# Patient Record
Sex: Female | Born: 1965 | Race: White | Hispanic: No | Smoking: Never smoker
Health system: Southern US, Community
[De-identification: ages and names within clinical notes are randomized; demographics above are authoritative.]

## PROBLEM LIST (undated history)

## (undated) DIAGNOSIS — Z460 Encounter for fitting and adjustment of spectacles and contact lenses: Secondary | ICD-10-CM

## (undated) DIAGNOSIS — F419 Anxiety disorder, unspecified: Secondary | ICD-10-CM

## (undated) DIAGNOSIS — F329 Major depressive disorder, single episode, unspecified: Secondary | ICD-10-CM

## (undated) DIAGNOSIS — F32A Depression, unspecified: Secondary | ICD-10-CM

## (undated) DIAGNOSIS — I10 Essential (primary) hypertension: Secondary | ICD-10-CM

## (undated) HISTORY — DX: Major depressive disorder, single episode, unspecified: F32.9

## (undated) HISTORY — PX: ECTOPIC PREGNANCY SURGERY: SHX613

## (undated) HISTORY — PX: INNER EAR SURGERY: SHX679

## (undated) HISTORY — DX: Depression, unspecified: F32.A

## (undated) HISTORY — DX: Anxiety disorder, unspecified: F41.9

---

## 1997-09-25 ENCOUNTER — Ambulatory Visit (HOSPITAL_COMMUNITY): Admission: RE | Admit: 1997-09-25 | Discharge: 1997-09-25 | Payer: Self-pay | Admitting: Gynecology

## 1997-11-14 ENCOUNTER — Other Ambulatory Visit: Admission: RE | Admit: 1997-11-14 | Discharge: 1997-11-14 | Payer: Self-pay | Admitting: Gynecology

## 1999-01-15 ENCOUNTER — Other Ambulatory Visit: Admission: RE | Admit: 1999-01-15 | Discharge: 1999-01-15 | Payer: Self-pay | Admitting: Gynecology

## 1999-07-01 ENCOUNTER — Inpatient Hospital Stay (HOSPITAL_COMMUNITY): Admission: AD | Admit: 1999-07-01 | Discharge: 1999-07-01 | Payer: Self-pay | Admitting: Gynecology

## 1999-07-09 ENCOUNTER — Encounter (INDEPENDENT_AMBULATORY_CARE_PROVIDER_SITE_OTHER): Payer: Self-pay

## 1999-07-09 ENCOUNTER — Inpatient Hospital Stay (HOSPITAL_COMMUNITY): Admission: AD | Admit: 1999-07-09 | Discharge: 1999-07-10 | Payer: Self-pay | Admitting: Gynecology

## 1999-12-26 ENCOUNTER — Encounter: Payer: Self-pay | Admitting: Family Medicine

## 1999-12-26 ENCOUNTER — Encounter: Admission: RE | Admit: 1999-12-26 | Discharge: 1999-12-26 | Payer: Self-pay | Admitting: Family Medicine

## 2000-03-27 ENCOUNTER — Other Ambulatory Visit: Admission: RE | Admit: 2000-03-27 | Discharge: 2000-03-27 | Payer: Self-pay | Admitting: Gynecology

## 2001-04-27 ENCOUNTER — Other Ambulatory Visit: Admission: RE | Admit: 2001-04-27 | Discharge: 2001-04-27 | Payer: Self-pay | Admitting: Gynecology

## 2002-06-13 ENCOUNTER — Other Ambulatory Visit: Admission: RE | Admit: 2002-06-13 | Discharge: 2002-06-13 | Payer: Self-pay | Admitting: Gynecology

## 2003-08-01 ENCOUNTER — Other Ambulatory Visit: Admission: RE | Admit: 2003-08-01 | Discharge: 2003-08-01 | Payer: Self-pay | Admitting: Gynecology

## 2003-08-30 ENCOUNTER — Ambulatory Visit (HOSPITAL_COMMUNITY): Admission: RE | Admit: 2003-08-30 | Discharge: 2003-08-30 | Payer: Self-pay | Admitting: Gynecology

## 2004-08-27 ENCOUNTER — Other Ambulatory Visit: Admission: RE | Admit: 2004-08-27 | Discharge: 2004-08-27 | Payer: Self-pay | Admitting: Gynecology

## 2005-01-22 ENCOUNTER — Emergency Department (HOSPITAL_COMMUNITY): Admission: EM | Admit: 2005-01-22 | Discharge: 2005-01-22 | Payer: Self-pay | Admitting: Emergency Medicine

## 2006-01-22 ENCOUNTER — Other Ambulatory Visit: Admission: RE | Admit: 2006-01-22 | Discharge: 2006-01-22 | Payer: Self-pay | Admitting: Gynecology

## 2006-07-04 ENCOUNTER — Emergency Department (HOSPITAL_COMMUNITY): Admission: EM | Admit: 2006-07-04 | Discharge: 2006-07-04 | Payer: Self-pay | Admitting: Family Medicine

## 2007-02-16 ENCOUNTER — Other Ambulatory Visit: Admission: RE | Admit: 2007-02-16 | Discharge: 2007-02-16 | Payer: Self-pay | Admitting: Gynecology

## 2008-09-26 ENCOUNTER — Ambulatory Visit (HOSPITAL_COMMUNITY): Admission: RE | Admit: 2008-09-26 | Discharge: 2008-09-26 | Payer: Self-pay | Admitting: Gynecology

## 2008-10-02 ENCOUNTER — Encounter: Admission: RE | Admit: 2008-10-02 | Discharge: 2008-10-02 | Payer: Self-pay | Admitting: Gynecology

## 2009-03-29 ENCOUNTER — Encounter: Admission: RE | Admit: 2009-03-29 | Discharge: 2009-03-29 | Payer: Self-pay | Admitting: Gynecology

## 2009-10-11 ENCOUNTER — Encounter: Admission: RE | Admit: 2009-10-11 | Discharge: 2009-10-11 | Payer: Self-pay | Admitting: Gynecology

## 2012-10-25 ENCOUNTER — Other Ambulatory Visit: Payer: Self-pay | Admitting: Gynecology

## 2012-10-25 DIAGNOSIS — R928 Other abnormal and inconclusive findings on diagnostic imaging of breast: Secondary | ICD-10-CM

## 2012-11-23 ENCOUNTER — Ambulatory Visit
Admission: RE | Admit: 2012-11-23 | Discharge: 2012-11-23 | Disposition: A | Discharge status: Home / Self Care | Payer: BC Managed Care – PPO | Source: Ambulatory Visit | Attending: Gynecology | Admitting: Gynecology

## 2012-11-23 ENCOUNTER — Other Ambulatory Visit: Payer: Self-pay | Admitting: Gynecology

## 2012-11-23 DIAGNOSIS — R928 Other abnormal and inconclusive findings on diagnostic imaging of breast: Secondary | ICD-10-CM

## 2012-11-23 DIAGNOSIS — N6320 Unspecified lump in the left breast, unspecified quadrant: Secondary | ICD-10-CM

## 2012-11-30 ENCOUNTER — Other Ambulatory Visit: Payer: Self-pay | Admitting: Gynecology

## 2012-11-30 ENCOUNTER — Ambulatory Visit
Admission: RE | Admit: 2012-11-30 | Discharge: 2012-11-30 | Disposition: A | Discharge status: Home / Self Care | Payer: BC Managed Care – PPO | Source: Ambulatory Visit | Attending: Gynecology | Admitting: Gynecology

## 2012-11-30 DIAGNOSIS — N6320 Unspecified lump in the left breast, unspecified quadrant: Secondary | ICD-10-CM

## 2012-12-09 ENCOUNTER — Encounter (INDEPENDENT_AMBULATORY_CARE_PROVIDER_SITE_OTHER): Payer: Self-pay | Admitting: Surgery

## 2012-12-09 ENCOUNTER — Ambulatory Visit (INDEPENDENT_AMBULATORY_CARE_PROVIDER_SITE_OTHER): Payer: BC Managed Care – PPO | Admitting: Surgery

## 2012-12-09 VITALS — BP 160/92 | HR 64 | Resp 16 | Ht 63.5 in | Wt 206.0 lb

## 2012-12-09 DIAGNOSIS — D249 Benign neoplasm of unspecified breast: Secondary | ICD-10-CM | POA: Insufficient documentation

## 2012-12-09 DIAGNOSIS — D242 Benign neoplasm of left breast: Secondary | ICD-10-CM

## 2012-12-09 NOTE — Patient Instructions (Signed)
We will schedule surgery to remove a small lump from the left breast

## 2012-12-09 NOTE — Progress Notes (Signed)
Patient ID: Bonnie Russell, female   DOB: [birth year], 47 y.o.   MRN: [id number]  Chief Complaint  Patient presents with  . Breast Mass    left    HPI [name] is a 47 y.o. female.  A recent mammogram showed an abnormality with calcifications in the upper outer quadrant of the left breast. 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 Surgical excisional biopsy was recommended. She is having no other breast problems or symptoms and has not had prior breast problems. She is not clear on her family history as her mother was adopted.  HPI  Past Medical History  Diagnosis Date  . Anxiety   . Depression     Past Surgical History  Procedure Laterality Date  . Inner ear surgery  as child    x 2  . Ectopic pregnancy surgery  1992, 1998, 2001    Family History  Problem Relation Age of Onset  . Throat cancer Paternal Grandfather     Social History History  Substance Use Topics  . Smoking status: Never Smoker   . Smokeless tobacco: Not on file  . Alcohol Use: Yes     Comment: 2-3 beers/wine weekly    Allergies  Allergen Reactions  . Morphine And Related Itching    Current Outpatient Prescriptions  Medication Sig Dispense Refill  . ALPRAZolam (XANAX) 1 MG tablet Take 1 mg by mouth at bedtime as needed for sleep.      . Ascorbic Acid (VITAMIN C) 1000 MG tablet Take 1,000 mg by mouth daily.      . citalopram (CELEXA) 40 MG tablet Take 40 mg by mouth daily.      . Multiple Vitamin (MULTIVITAMIN) tablet Take 1 tablet by mouth daily.      . valACYclovir (VALTREX) 500 MG tablet Take 500 mg by mouth 2 (two) times daily.      . vitamin E (VITAMIN E) 400 UNIT capsule Take 400 Units by mouth daily.       No current facility-administered medications for this visit.    Review of Systems Review of Systems  Constitutional: Negative for fever, chills and unexpected weight change.  HENT: Negative for hearing loss, congestion, sore  throat, trouble swallowing and voice change.   Eyes: Negative for visual disturbance.  Respiratory: Negative for cough and wheezing.   Cardiovascular: Negative for chest pain, palpitations and leg swelling.  Gastrointestinal: Negative for nausea, vomiting, abdominal pain, diarrhea, constipation, blood in stool, abdominal distention and anal bleeding.  Genitourinary: Negative for hematuria, vaginal bleeding and difficulty urinating.  Musculoskeletal: Negative for arthralgias.  Skin: Negative for rash and wound.  Neurological: Negative for seizures, syncope and headaches.  Hematological: Negative for adenopathy. Does not bruise/bleed easily.  Psychiatric/Behavioral: Negative for confusion.    Blood pressure 160/92, pulse 64, resp. rate 16, height 5' 3.5" (1.613 m), weight 206 lb (93.441 kg).  Physical Exam Physical Exam  Pulmonary/Chest:    Lymphadenopathy:    She has no cervical adenopathy.    She has no axillary adenopathy.       Right: No supraclavicular adenopathy present.       Left: No supraclavicular adenopathy present.    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 She understand that the  chance of removal of the abnormal area is very good, but that occasionally we are unable to locate it and may have to do a second procedure. We also discussed the possibility of a second procedure to get additional tissue. Risks of surgery such as bleeding and infection have also been explained, as well as the implications of not doing the surgery. She understands and wishes to proceed.         Alinna Siple J 12/09/2012, 3:33 PM

## 2013-02-22 ENCOUNTER — Other Ambulatory Visit (INDEPENDENT_AMBULATORY_CARE_PROVIDER_SITE_OTHER): Payer: Self-pay | Admitting: Surgery

## 2013-02-22 DIAGNOSIS — N6320 Unspecified lump in the left breast, unspecified quadrant: Secondary | ICD-10-CM

## 2013-02-24 ENCOUNTER — Encounter (HOSPITAL_BASED_OUTPATIENT_CLINIC_OR_DEPARTMENT_OTHER): Payer: Self-pay | Admitting: *Deleted

## 2013-03-03 ENCOUNTER — Encounter (HOSPITAL_BASED_OUTPATIENT_CLINIC_OR_DEPARTMENT_OTHER): Payer: BC Managed Care – PPO | Admitting: Anesthesiology

## 2013-03-03 ENCOUNTER — Ambulatory Visit (HOSPITAL_BASED_OUTPATIENT_CLINIC_OR_DEPARTMENT_OTHER)
Admission: RE | Admit: 2013-03-03 | Discharge: 2013-03-03 | Disposition: A | Discharge status: Home / Self Care | Payer: BC Managed Care – PPO | Source: Ambulatory Visit | Attending: Surgery | Admitting: Surgery

## 2013-03-03 ENCOUNTER — Ambulatory Visit (HOSPITAL_BASED_OUTPATIENT_CLINIC_OR_DEPARTMENT_OTHER): Payer: BC Managed Care – PPO | Admitting: Anesthesiology

## 2013-03-03 ENCOUNTER — Encounter (HOSPITAL_BASED_OUTPATIENT_CLINIC_OR_DEPARTMENT_OTHER): Payer: Self-pay | Admitting: *Deleted

## 2013-03-03 ENCOUNTER — Encounter (HOSPITAL_BASED_OUTPATIENT_CLINIC_OR_DEPARTMENT_OTHER)
Admission: RE | Disposition: A | Discharge status: Home / Self Care | Payer: Self-pay | Source: Ambulatory Visit | Attending: Surgery

## 2013-03-03 ENCOUNTER — Ambulatory Visit
Admission: RE | Admit: 2013-03-03 | Discharge: 2013-03-03 | Disposition: A | Discharge status: Home / Self Care | Payer: BC Managed Care – PPO | Source: Ambulatory Visit | Attending: Surgery | Admitting: Surgery

## 2013-03-03 DIAGNOSIS — N6019 Diffuse cystic mastopathy of unspecified breast: Secondary | ICD-10-CM

## 2013-03-03 DIAGNOSIS — R928 Other abnormal and inconclusive findings on diagnostic imaging of breast: Secondary | ICD-10-CM | POA: Insufficient documentation

## 2013-03-03 DIAGNOSIS — N6320 Unspecified lump in the left breast, unspecified quadrant: Secondary | ICD-10-CM

## 2013-03-03 DIAGNOSIS — R920 Mammographic microcalcification found on diagnostic imaging of breast: Secondary | ICD-10-CM

## 2013-03-03 DIAGNOSIS — D242 Benign neoplasm of left breast: Secondary | ICD-10-CM

## 2013-03-03 HISTORY — DX: Encounter for fitting and adjustment of spectacles and contact lenses: Z46.0

## 2013-03-03 HISTORY — PX: BREAST BIOPSY: SHX20

## 2013-03-03 LAB — POCT HEMOGLOBIN-HEMACUE: Hemoglobin: 14.4 g/dL (ref 12.0–15.0)

## 2013-03-03 SURGERY — BREAST BIOPSY WITH NEEDLE LOCALIZATION
Anesthesia: General | Site: Breast | Laterality: Left | Wound class: Clean

## 2013-03-03 MED ORDER — OXYCODONE HCL 5 MG PO TABS
5.0000 mg | ORAL_TABLET | Freq: Once | ORAL | Status: AC | PRN
  Administered 2013-03-03: 5 mg via ORAL

## 2013-03-03 MED ORDER — LIDOCAINE HCL (CARDIAC) 20 MG/ML IV SOLN
INTRAVENOUS | Status: DC | PRN
  Administered 2013-03-03: 100 mg via INTRAVENOUS

## 2013-03-03 MED ORDER — HYDROMORPHONE HCL PF 1 MG/ML IJ SOLN
0.2500 mg | INTRAMUSCULAR | Status: DC | PRN
  Administered 2013-03-03 (×2): 0.25 mg via INTRAVENOUS

## 2013-03-03 MED ORDER — CHLORHEXIDINE GLUCONATE 4 % EX LIQD: 1.0000 "application " | Freq: Once | CUTANEOUS | Status: DC

## 2013-03-03 MED ORDER — FENTANYL CITRATE 0.05 MG/ML IJ SOLN
INTRAMUSCULAR | Status: DC | PRN
  Administered 2013-03-03 (×2): 50 ug via INTRAVENOUS

## 2013-03-03 MED ORDER — DEXAMETHASONE SODIUM PHOSPHATE 4 MG/ML IJ SOLN
INTRAMUSCULAR | Status: DC | PRN
  Administered 2013-03-03: 10 mg via INTRAVENOUS

## 2013-03-03 MED ORDER — LACTATED RINGERS IV SOLN
INTRAVENOUS | Status: DC
  Administered 2013-03-03: 11:00:00 via INTRAVENOUS

## 2013-03-03 MED ORDER — OXYCODONE HCL 5 MG/5ML PO SOLN: 5.0000 mg | Freq: Once | ORAL | Status: AC | PRN

## 2013-03-03 MED ORDER — CEFAZOLIN SODIUM-DEXTROSE 2-3 GM-% IV SOLR
2.0000 g | INTRAVENOUS | Status: AC
  Administered 2013-03-03: 2 g via INTRAVENOUS

## 2013-03-03 MED ORDER — MIDAZOLAM HCL 2 MG/2ML IJ SOLN: 1.0000 mg | INTRAMUSCULAR | Status: DC | PRN

## 2013-03-03 MED ORDER — PROPOFOL 10 MG/ML IV BOLUS
INTRAVENOUS | Status: DC | PRN
  Administered 2013-03-03: 200 mg via INTRAVENOUS

## 2013-03-03 MED ORDER — MIDAZOLAM HCL 5 MG/5ML IJ SOLN
INTRAMUSCULAR | Status: DC | PRN
  Administered 2013-03-03: 2 mg via INTRAVENOUS

## 2013-03-03 MED ORDER — FENTANYL CITRATE 0.05 MG/ML IJ SOLN: 50.0000 ug | INTRAMUSCULAR | Status: DC | PRN

## 2013-03-03 MED ORDER — BUPIVACAINE HCL (PF) 0.25 % IJ SOLN
INTRAMUSCULAR | Status: DC | PRN
  Administered 2013-03-03: 20 mL

## 2013-03-03 MED ORDER — ONDANSETRON HCL 4 MG/2ML IJ SOLN
INTRAMUSCULAR | Status: DC | PRN
  Administered 2013-03-03: 4 mg via INTRAMUSCULAR

## 2013-03-03 MED ORDER — METOCLOPRAMIDE HCL 5 MG/ML IJ SOLN: 10.0000 mg | Freq: Once | INTRAMUSCULAR | Status: DC | PRN

## 2013-03-03 MED ORDER — HYDROCODONE-ACETAMINOPHEN 10-325 MG PO TABS: 1.0000 | ORAL_TABLET | Freq: Four times a day (QID) | ORAL | Status: AC | PRN

## 2013-03-03 SURGICAL SUPPLY — 50 items
ADH SKN CLS APL DERMABOND .7 (GAUZE/BANDAGES/DRESSINGS) ×1
APPLICATOR COTTON TIP 6IN STRL (MISCELLANEOUS) IMPLANT
BINDER BREAST LRG (GAUZE/BANDAGES/DRESSINGS) IMPLANT
BINDER BREAST MEDIUM (GAUZE/BANDAGES/DRESSINGS) IMPLANT
BINDER BREAST XLRG (GAUZE/BANDAGES/DRESSINGS) ×1 IMPLANT
BINDER BREAST XXLRG (GAUZE/BANDAGES/DRESSINGS) IMPLANT
BLADE HEX COATED 2.75 (ELECTRODE) ×2 IMPLANT
BLADE SURG 15 STRL LF DISP TIS (BLADE) ×1 IMPLANT
BLADE SURG 15 STRL SS (BLADE) ×2
CANISTER SUCTION 1200CC (MISCELLANEOUS) ×2 IMPLANT
CHLORAPREP W/TINT 26ML (MISCELLANEOUS) ×2 IMPLANT
CLIP TI MEDIUM 6 (CLIP) IMPLANT
CLIP TI WIDE RED SMALL 6 (CLIP) IMPLANT
CLOTH BEACON ORANGE TIMEOUT ST (SAFETY) ×1 IMPLANT
COVER MAYO STAND STRL (DRAPES) ×2 IMPLANT
COVER TABLE BACK 60X90 (DRAPES) ×2 IMPLANT
DECANTER SPIKE VIAL GLASS SM (MISCELLANEOUS) IMPLANT
DERMABOND ADVANCED (GAUZE/BANDAGES/DRESSINGS) ×1
DERMABOND ADVANCED .7 DNX12 (GAUZE/BANDAGES/DRESSINGS) ×1 IMPLANT
DEVICE DUBIN W/COMP PLATE 8390 (MISCELLANEOUS) ×2 IMPLANT
DRAPE LAPAROTOMY TRNSV 102X78 (DRAPE) ×2 IMPLANT
DRAPE SURG 17X23 STRL (DRAPES) IMPLANT
DRAPE UTILITY XL STRL (DRAPES) ×3 IMPLANT
ELECT REM PT RETURN 9FT ADLT (ELECTROSURGICAL) ×2
ELECTRODE REM PT RTRN 9FT ADLT (ELECTROSURGICAL) ×1 IMPLANT
GLOVE BIOGEL PI IND STRL 7.5 (GLOVE) IMPLANT
GLOVE BIOGEL PI INDICATOR 7.5 (GLOVE) ×1
GLOVE ECLIPSE 7.0 STRL STRAW (GLOVE) ×1 IMPLANT
GLOVE EUDERMIC 7 POWDERFREE (GLOVE) ×2 IMPLANT
GOWN PREVENTION PLUS XLARGE (GOWN DISPOSABLE) ×3 IMPLANT
GOWN PREVENTION PLUS XXLARGE (GOWN DISPOSABLE) ×1 IMPLANT
KIT MARKER MARGIN INK (KITS) IMPLANT
NDL HYPO 25X1 1.5 SAFETY (NEEDLE) ×1 IMPLANT
NEEDLE HYPO 25X1 1.5 SAFETY (NEEDLE) ×2 IMPLANT
NS IRRIG 1000ML POUR BTL (IV SOLUTION) ×1 IMPLANT
PACK BASIN DAY SURGERY FS (CUSTOM PROCEDURE TRAY) ×2 IMPLANT
PENCIL BUTTON HOLSTER BLD 10FT (ELECTRODE) ×2 IMPLANT
SHEET MEDIUM DRAPE 40X70 STRL (DRAPES) IMPLANT
SLEEVE SCD COMPRESS KNEE MED (MISCELLANEOUS) ×2 IMPLANT
SPONGE GAUZE 4X4 12PLY (GAUZE/BANDAGES/DRESSINGS) IMPLANT
SPONGE INTESTINAL PEANUT (DISPOSABLE) IMPLANT
SPONGE LAP 4X18 X RAY DECT (DISPOSABLE) ×2 IMPLANT
STAPLER VISISTAT 35W (STAPLE) IMPLANT
SUT MNCRL AB 4-0 PS2 18 (SUTURE) ×2 IMPLANT
SUT SILK 0 TIES 10X30 (SUTURE) IMPLANT
SUT VICRYL 3-0 CR8 SH (SUTURE) ×2 IMPLANT
SYR CONTROL 10ML LL (SYRINGE) ×2 IMPLANT
TOWEL OR NON WOVEN STRL DISP B (DISPOSABLE) ×2 IMPLANT
TUBE CONNECTING 20X1/4 (TUBING) ×2 IMPLANT
YANKAUER SUCT BULB TIP NO VENT (SUCTIONS) ×2 IMPLANT

## 2013-03-03 NOTE — Op Note (Signed)
Bonnie Russell  05/17/1966  [id number]  03/03/2013   Preoperative diagnosis: left breast mass, upper outer quadrant, papilloma by needle core biopsy  Postoperative diagnosis: same  Procedure: wire localized excision left breast mass  Surgeon: Currie Paris, MD, FACS  Anesthesia: General  Clinical History and Indications: this patient presents for a guidewire localized excision of a left breast mass, upper outer quadrant, papilloma on the core biopsy.  Description of procedure: The patient was seen in the holding area and the plans for the procedure reviewed. The left breast was marked as the operative side. The wire localizing films were reviewed.  The patient was taken to the operating room and after satisfactory general anesthesia had been obtained the left breast was prepped and draped and the timeout was performed.  The incision was made over the presumed area of the mass. The guidewire entered the lateral and track somewhat superior and more medial. The abnormality appeared to be in line with the lateral edges of the areola but about 2-3 cm above it.Skin flaps were raised and using cautery the area was completely excised. Bleeders were controlled with either cautery or sutures as needed.  A specimen mammogram showed the clip to be in the middle of the surgical specimen. The specimen was marked with sutures and ink for pathological orientation.  After achieving hemostasis, the incision was closed with 3-0 Vicryl, 4-0 Monocryl subcuticular, and Dermabond.  The patient tolerated the procedure well. There were no operative complications. All counts were correct.   EBL: minimal  Currie Paris, MD, FACS 03/03/2013 12:04 PM

## 2013-03-03 NOTE — Transfer of Care (Signed)
Immediate Anesthesia Transfer of Care Note  Patient: Bonnie Russell  Procedure(s) Performed: Procedure(s):  NEEDLE LOCALIZATION REMOVE LEFT BREAST LUMP (Left)  Patient Location: PACU  Anesthesia Type:General  Level of Consciousness: awake, oriented and patient cooperative  Airway & Oxygen Therapy: Patient Spontanous Breathing and Patient connected to face mask oxygen  Post-op Assessment: Report given to PACU RN and Post -op Vital signs reviewed and stable  Post vital signs: Reviewed and stable  Complications: No apparent anesthesia complications

## 2013-03-03 NOTE — Anesthesia Preprocedure Evaluation (Signed)
Anesthesia Evaluation  Patient identified by MRN, date of birth, ID band Patient awake    Reviewed: Allergy & Precautions, H&P , NPO status , Patient's Chart, lab work & pertinent test results, reviewed documented beta blocker date and time   Airway Mallampati: II TM Distance: >3 FB Neck ROM: full    Dental   Pulmonary neg pulmonary ROS,  breath sounds clear to auscultation        Cardiovascular negative cardio ROS  Rhythm:regular     Neuro/Psych PSYCHIATRIC DISORDERS Anxiety Depression negative neurological ROS     GI/Hepatic negative GI ROS, Neg liver ROS,   Endo/Other  negative endocrine ROS  Renal/GU negative Renal ROS  negative genitourinary   Musculoskeletal   Abdominal   Peds  Hematology negative hematology ROS (+)   Anesthesia Other Findings See surgeon's H&P   Reproductive/Obstetrics negative OB ROS                           Anesthesia Physical Anesthesia Plan  ASA: II  Anesthesia Plan: General   Post-op Pain Management:    Induction: Intravenous  Airway Management Planned: LMA  Additional Equipment:   Intra-op Plan:   Post-operative Plan:   Informed Consent: I have reviewed the patients History and Physical, chart, labs and discussed the procedure including the risks, benefits and alternatives for the proposed anesthesia with the patient or authorized representative who has indicated his/her understanding and acceptance.   Dental Advisory Given  Plan Discussed with: CRNA and Surgeon  Anesthesia Plan Comments:         Anesthesia Quick Evaluation

## 2013-03-03 NOTE — Anesthesia Postprocedure Evaluation (Signed)
Anesthesia Post Note  Patient: Bonnie Russell  Procedure(s) Performed: Procedure(s) (LRB):  NEEDLE LOCALIZATION REMOVE LEFT BREAST LUMP (Left)  Anesthesia type: General  Patient location: PACU  Post pain: Pain level controlled  Post assessment: Patient's Cardiovascular Status Stable  Last Vitals:  Filed Vitals:   03/03/13 1245  BP: 143/90  Pulse: 84  Temp:   Resp: 16    Post vital signs: Reviewed and stable  Level of consciousness: alert  Complications: No apparent anesthesia complications

## 2013-03-03 NOTE — H&P (Signed)
NAME: Bonnie Russell       DOB: 11-13-1965           DATE: 02/22/2013       WUJ:[id number]  CC: No chief complaint on file.   HPI:  Chief Complaint   Patient presents with   .  Breast Mass     left   HPI  Bonnie Russell is a 47 y.o. female. A recent mammogram showed an abnormality with calcifications in the upper outer quadrant of the left breast. 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 Surgical excisional biopsy was recommended. She is having no other breast problems or symptoms and has not had prior breast problems. She is not clear on her family history as her mother was adopted. She presents today for wire localized excision   EXAM: Vital signs: BP 153/99  Pulse 92  Temp(Src) 97.8 F (36.6 C) (Oral)  Resp 20  Ht 5' 3.5" (1.613 m)  Wt 212 lb (96.163 kg)  BMI 36.96 kg/m2  SpO2 97%  LMP 02/28/2013  GENERAL:  The patient is alert, oriented, and generally healthy-appearing, NAD. Mood and affect are normal.  HEENT:  The head is normocephalic, the eyes nonicteric, the pupils were round regular and equal. EOMs are normal. Pharynx normal. Dentition good.  NECK:  The neck is supple and there are no masses or thyromegaly.  LUNGS: Normal respirations and clear to auscultation.  HEART: Regular rhythm, with no murmurs rubs or gallops. Pulses are intact carotid dorsalis pedis and posterior tibial. No significant varicosities are noted.  BREASTS:  Symmetric, no palp ass, guuide wire in place on left  ABDOMEN: Soft, flat, and nontender. No masses or organomegaly is noted. No hernias are noted. Bowel sounds are normal.  EXTREMITIES:  Good range of motion, no edema.  IMP: Left breast mass, prob papilloma  PLAN: I have discussed the indications for the lumpectomy and described the procedure. She understand that the chance of removal of the abnormal area is very good, but that occasionally we are unable to locate it and may  have to do a second procedure. We also discussed the possibility of a second procedure to get additional tissue. Risks of surgery such as bleeding and infection have also been explained, as well as the implications of not doing the surgery. She understands and wishes to proceed.   Erma Joubert J 03/03/2013

## 2013-03-03 NOTE — Anesthesia Procedure Notes (Signed)
Procedure Name: LMA Insertion Date/Time: 03/03/2013 11:27 AM Performed by: Gar Gibbon Pre-anesthesia Checklist: Patient identified, Emergency Drugs available, Suction available and Patient being monitored Patient Re-evaluated:Patient Re-evaluated prior to inductionOxygen Delivery Method: Circle System Utilized Preoxygenation: Pre-oxygenation with 100% oxygen Intubation Type: IV induction Ventilation: Mask ventilation without difficulty LMA: LMA inserted LMA Size: 4.0 Number of attempts: 1 Airway Equipment and Method: bite block Placement Confirmation: positive ETCO2 Tube secured with: Tape Dental Injury: Teeth and Oropharynx as per pre-operative assessment

## 2013-03-04 ENCOUNTER — Encounter (HOSPITAL_BASED_OUTPATIENT_CLINIC_OR_DEPARTMENT_OTHER): Payer: Self-pay | Admitting: Surgery

## 2013-03-06 NOTE — Progress Notes (Signed)
Quick Note:  Tell her path is benign and as expected ______ 

## 2013-03-07 ENCOUNTER — Telehealth (INDEPENDENT_AMBULATORY_CARE_PROVIDER_SITE_OTHER): Payer: Self-pay

## 2013-03-07 NOTE — Telephone Encounter (Signed)
Spoke with pt relaying below message.  Pt happy with results.

## 2013-03-07 NOTE — Telephone Encounter (Signed)
Message copied by Horatio Pel on Mon Mar 07, 2013 11:13 AM ------      Message from: Currie Paris      Created: Sun Mar 06, 2013  5:26 PM       Tell her path is benign and as expected ------

## 2013-03-07 NOTE — Telephone Encounter (Signed)
Message copied by Horatio Pel on Mon Mar 07, 2013  8:51 AM ------      Message from: Currie Paris      Created: Sun Mar 06, 2013  5:26 PM       Tell her path is benign and as expected ------

## 2013-03-23 ENCOUNTER — Encounter (INDEPENDENT_AMBULATORY_CARE_PROVIDER_SITE_OTHER): Payer: BC Managed Care – PPO | Admitting: Surgery

## 2014-12-12 ENCOUNTER — Other Ambulatory Visit: Payer: Self-pay | Admitting: Gynecology

## 2014-12-13 LAB — CYTOLOGY - PAP

## 2015-10-17 DIAGNOSIS — E66812 Obesity, class 2: Secondary | ICD-10-CM | POA: Insufficient documentation

## 2015-10-17 DIAGNOSIS — F411 Generalized anxiety disorder: Secondary | ICD-10-CM | POA: Insufficient documentation

## 2015-10-17 DIAGNOSIS — F988 Other specified behavioral and emotional disorders with onset usually occurring in childhood and adolescence: Secondary | ICD-10-CM | POA: Insufficient documentation

## 2015-10-17 DIAGNOSIS — F329 Major depressive disorder, single episode, unspecified: Secondary | ICD-10-CM | POA: Insufficient documentation

## 2015-10-17 DIAGNOSIS — F1021 Alcohol dependence, in remission: Secondary | ICD-10-CM | POA: Insufficient documentation

## 2015-10-25 DIAGNOSIS — N951 Menopausal and female climacteric states: Secondary | ICD-10-CM | POA: Insufficient documentation

## 2017-11-04 ENCOUNTER — Encounter (HOSPITAL_COMMUNITY): Payer: Self-pay | Admitting: Emergency Medicine

## 2017-11-04 ENCOUNTER — Emergency Department (HOSPITAL_COMMUNITY)
Admission: EM | Admit: 2017-11-04 | Discharge: 2017-11-04 | Disposition: A | Discharge status: Home / Self Care | Payer: Self-pay | Attending: Emergency Medicine | Admitting: Emergency Medicine

## 2017-11-04 ENCOUNTER — Emergency Department (HOSPITAL_COMMUNITY): Payer: Self-pay

## 2017-11-04 DIAGNOSIS — R5383 Other fatigue: Secondary | ICD-10-CM | POA: Insufficient documentation

## 2017-11-04 DIAGNOSIS — R112 Nausea with vomiting, unspecified: Secondary | ICD-10-CM | POA: Insufficient documentation

## 2017-11-04 DIAGNOSIS — Z79899 Other long term (current) drug therapy: Secondary | ICD-10-CM | POA: Insufficient documentation

## 2017-11-04 DIAGNOSIS — R200 Anesthesia of skin: Secondary | ICD-10-CM | POA: Insufficient documentation

## 2017-11-04 DIAGNOSIS — N3000 Acute cystitis without hematuria: Secondary | ICD-10-CM | POA: Insufficient documentation

## 2017-11-04 LAB — COMPREHENSIVE METABOLIC PANEL
ALT: 65 U/L — ABNORMAL HIGH (ref 14–54)
AST: 100 U/L — ABNORMAL HIGH (ref 15–41)
Albumin: 4.1 g/dL (ref 3.5–5.0)
Alkaline Phosphatase: 115 U/L (ref 38–126)
Anion gap: 10 (ref 5–15)
BUN: 10 mg/dL (ref 6–20)
CHLORIDE: 102 mmol/L (ref 101–111)
CO2: 25 mmol/L (ref 22–32)
Calcium: 8.6 mg/dL — ABNORMAL LOW (ref 8.9–10.3)
Creatinine, Ser: 1.02 mg/dL — ABNORMAL HIGH (ref 0.44–1.00)
GFR calc Af Amer: >60 mL/min (ref >60)
GFR calc non Af Amer: >60 mL/min (ref >60)
Glucose, Bld: 147 mg/dL — ABNORMAL HIGH (ref 65–99)
Potassium: 3.5 mmol/L (ref 3.5–5.1)
SODIUM: 137 mmol/L (ref 135–145)
Total Bilirubin: 0.4 mg/dL (ref 0.3–1.2)
Total Protein: 6.7 g/dL (ref 6.5–8.1)

## 2017-11-04 LAB — CBC
HCT: 34 % — ABNORMAL LOW (ref 36.0–46.0)
HEMOGLOBIN: 10.7 g/dL — AB (ref 12.0–15.0)
MCH: 23.8 pg — AB (ref 26.0–34.0)
MCHC: 31.5 g/dL (ref 30.0–36.0)
MCV: 75.7 fL — ABNORMAL LOW (ref 78.0–100.0)
Platelets: 132 10*3/uL — ABNORMAL LOW (ref 150–400)
RBC: 4.49 MIL/uL (ref 3.87–5.11)
RDW: 15.7 % — ABNORMAL HIGH (ref 11.5–15.5)
WBC: 3.4 10*3/uL — ABNORMAL LOW (ref 4.0–10.5)

## 2017-11-04 LAB — URINALYSIS, ROUTINE W REFLEX MICROSCOPIC
Bilirubin Urine: NEGATIVE
GLUCOSE, UA: NEGATIVE mg/dL
Hgb urine dipstick: NEGATIVE
Ketones, ur: NEGATIVE mg/dL
Nitrite: POSITIVE — AB
Protein, ur: 30 mg/dL — AB
SPECIFIC GRAVITY, URINE: 1.01 (ref 1.005–1.030)
pH: >9 — ABNORMAL HIGH (ref 5.0–8.0)

## 2017-11-04 LAB — LIPASE, BLOOD: LIPASE: 37 U/L (ref 11–51)

## 2017-11-04 LAB — URINALYSIS, MICROSCOPIC (REFLEX)

## 2017-11-04 LAB — I-STAT BETA HCG BLOOD, ED (MC, WL, AP ONLY)

## 2017-11-04 MED ORDER — SODIUM CHLORIDE 0.9 % IV SOLN
1.0000 g | Freq: Once | INTRAVENOUS | Status: AC
  Administered 2017-11-04: 1 g via INTRAVENOUS
  Filled 2017-11-04: qty 10

## 2017-11-04 MED ORDER — SODIUM CHLORIDE 0.9 % IV BOLUS
1000.0000 mL | Freq: Once | INTRAVENOUS | Status: AC
  Administered 2017-11-04: 1000 mL via INTRAVENOUS

## 2017-11-04 MED ORDER — PROMETHAZINE HCL 25 MG PO TABS: 25.0000 mg | ORAL_TABLET | Freq: Four times a day (QID) | ORAL | 1 refills | Status: DC | PRN

## 2017-11-04 MED ORDER — SODIUM CHLORIDE 0.9 % IV SOLN: INTRAVENOUS | Status: DC

## 2017-11-04 MED ORDER — CEPHALEXIN 500 MG PO CAPS: 500.0000 mg | ORAL_CAPSULE | Freq: Four times a day (QID) | ORAL | 0 refills | Status: DC

## 2017-11-04 NOTE — ED Triage Notes (Signed)
Pt states that this morning she spiked a fever 99-100 and knows that something is going on with her body. Having numbness from head to her toes that started this morning. Pt reports that something is wrong, "I am having trouble talking to you can't you tell I am having to talk to you through clinched teeth". Pt only talking that way when focused on it. When answering other questions teeth aren't clinched.

## 2017-11-04 NOTE — Discharge Instructions (Addendum)
Take the antibiotic as directed.  Take the Phenergan as needed for nausea and vomiting.  If you are not improved over the next 2 to 3 days or return for follow-up.  Return earlier for any new or worse symptoms.  Urine culture has been sent and will probably take about 2 days for confirmation of the type of bacteria.  You will be contacted if the antibiotic that you have been given today is not covering the infection.

## 2017-11-04 NOTE — ED Notes (Signed)
ED Provider at bedside. 

## 2017-11-04 NOTE — ED Provider Notes (Signed)
[facility] DEPT Provider Note   CSN: [id number] Arrival date & time: 11/04/17  1302     History   Chief Complaint Chief Complaint  Patient presents with  . Fever  . Fatigue  . Emesis  . Numbness    HPI Bonnie Russell is a 52 y.o. female.  Patient brought in by EMS.  Patient believed that she had a viral type infection a week ago.  Seem to be improving but then fevers came back on Sunday night.  This morning at about 5 in the morning she had sweats and felt feverish and then seemed as if the fever had broke.  But then she went on to have nausea and vomiting about 3-4 episodes today.  No pain anywhere no headache no chest pain no abdominal pain.  No back pain has had no dysuria but has felt her urine has seems strong.  Patient after the nausea and vomiting started to have some type numbness and contractions in her toes and fingers.  Was having difficulty speaking.  Patient was concerned that this may be a stroke.  EMS arrived as to how she was negative on the stroke scale.  Patient did states she was feeling anxious.  Patient does have a history of anxiety and depression.  No new medications.     Past Medical History:  Diagnosis Date  . Anxiety   . Contact lens/glasses fitting    wears contacts or glasses  . Depression     Patient Active Problem List   Diagnosis Date Noted  . Intraductal papilloma of breast 12/09/2012    Past Surgical History:  Procedure Laterality Date  . BREAST BIOPSY Left 03/03/2013   Procedure:  NEEDLE LOCALIZATION REMOVE LEFT BREAST LUMP;  Surgeon: Haywood Lasso, MD;  Location: [facility];  Service: General;  Laterality: Left;  . [place], 2001  . INNER EAR SURGERY  as child   x 2     OB History   None      Home Medications    Prior to Admission medications   Medication Sig Start Date End Date Taking? Authorizing Provider  ALPRAZolam Duanne Moron) 1 MG  tablet Take 1 mg by mouth at bedtime as needed for sleep.   Yes [provider]  buPROPion (WELLBUTRIN SR) 150 MG 12 hr tablet Take 150 mg by mouth 2 (two) times daily. 08/03/17  Yes [provider]  citalopram (CELEXA) 40 MG tablet Take 40 mg by mouth daily.    Yes [provider]  meloxicam (MOBIC) 15 MG tablet Take 15 mg by mouth daily. 08/03/17  Yes [provider]  Phenyleph-Doxylamine-DM-APAP (NYQUIL SEVERE COLD/FLU PO) Take 1 Dose by mouth every 4 (four) hours.   Yes [provider]  valACYclovir (VALTREX) 500 MG tablet Take 500 mg by mouth daily.    Yes [provider]  cephALEXin (KEFLEX) 500 MG capsule Take 1 capsule (500 mg total) by mouth 4 (four) times daily. 11/04/17   Fredia Sorrow, MD  promethazine (PHENERGAN) 25 MG tablet Take 1 tablet (25 mg total) by mouth every 6 (six) hours as needed for nausea or vomiting. 11/04/17   Fredia Sorrow, MD    Family History Family History  Problem Relation Age of Onset  . Throat cancer Paternal Grandfather     Social History Social History   Tobacco Use  . Smoking status: Never Smoker  . Smokeless tobacco: Never Used  Substance Use Topics  .  Alcohol use: Yes    Comment: 2-3 beers/wine weekly  . Drug use: No     Allergies   Morphine and related and Sertraline   Review of Systems Review of Systems  Constitutional: Positive for chills, diaphoresis and fever.  HENT: Negative for congestion.   Eyes: Negative for visual disturbance.  Respiratory: Negative for shortness of breath.   Cardiovascular: Negative for chest pain.  Gastrointestinal: Positive for nausea and vomiting. Negative for abdominal pain.  Genitourinary: Negative for dysuria.  Musculoskeletal: Negative for back pain.  Skin: Negative for rash.  Neurological: Positive for tremors, speech difficulty and numbness. Negative for syncope, weakness and headaches.  Hematological: Does not bruise/bleed easily.    Psychiatric/Behavioral: Negative for confusion.     Physical Exam Updated Vital Signs BP (!) 148/94   Pulse 85   Temp 98.1 F (36.7 C) (Oral)   Resp 17   Ht 1.626 m (5\' 4" )   Wt 79.4 kg (175 lb)   LMP 10/14/2017   SpO2 99%   BMI 30.04 kg/m   Physical Exam  Constitutional: She is oriented to person, place, and time. She appears well-developed and well-nourished. No distress.  HENT:  Head: Normocephalic and atraumatic.  Mouth/Throat: Oropharynx is clear and moist.  Eyes: Pupils are equal, round, and reactive to light. Conjunctivae and EOM are normal.  Neck: Normal range of motion. Neck supple.  Cardiovascular: Normal rate, regular rhythm and normal heart sounds.  Pulmonary/Chest: Effort normal and breath sounds normal. No respiratory distress.  Abdominal: Soft. Bowel sounds are normal. There is no tenderness.  Musculoskeletal: Normal range of motion. She exhibits no edema.  Neurological: She is alert and oriented to person, place, and time. No cranial nerve deficit or sensory deficit. She exhibits normal muscle tone. Coordination normal.  Skin: Skin is warm. Capillary refill takes less than 2 seconds.  Nursing note and vitals reviewed.    ED Treatments / Results  Labs (all labs ordered are listed, but only abnormal results are displayed) Labs Reviewed  COMPREHENSIVE METABOLIC PANEL - Abnormal; Notable for the following components:      Result Value   Glucose, Bld 147 (*)    Creatinine, Ser 1.02 (*)    Calcium 8.6 (*)    AST 100 (*)    ALT 65 (*)    All other components within normal limits  CBC - Abnormal; Notable for the following components:   WBC 3.4 (*)    Hemoglobin 10.7 (*)    HCT 34.0 (*)    MCV 75.7 (*)    MCH 23.8 (*)    RDW 15.7 (*)    Platelets 132 (*)    All other components within normal limits  URINALYSIS, ROUTINE W REFLEX MICROSCOPIC - Abnormal; Notable for the following components:   APPearance CLOUDY (*)    pH >9.0 (*)    Protein, ur 30 (*)     Nitrite POSITIVE (*)    Leukocytes, UA TRACE (*)    All other components within normal limits  URINALYSIS, MICROSCOPIC (REFLEX) - Abnormal; Notable for the following components:   Bacteria, UA MANY (*)    All other components within normal limits  URINE CULTURE  LIPASE, BLOOD  I-STAT BETA HCG BLOOD, ED ([facility], [facility], [facility] ONLY)    EKG None  Radiology Dg Chest 2 View  Result Date: 11/04/2017 CLINICAL DATA:  Generalized weakness and fever this morning, single episode of vomiting EXAM: CHEST - 2 VIEW COMPARISON:  01/22/2005 FINDINGS: Upper normal heart size. Mediastinal  contours and pulmonary vascularity normal. Lungs clear. No pleural effusion or pneumothorax. Bones unremarkable. IMPRESSION: No acute abnormalities. Electronically Signed   By: Lavonia Dana M.D.   On: 11/04/2017 17:29   Ct Head Wo Contrast  Result Date: 11/04/2017 CLINICAL DATA:  Fever and weakness, nausea with single episode of vomiting today, numbness and tingling, ataxia EXAM: CT HEAD WITHOUT CONTRAST TECHNIQUE: Contiguous axial images were obtained from the base of the skull through the vertex without intravenous contrast. Sagittal and coronal MPR images reconstructed from axial data set. COMPARISON:  01/22/2005 FINDINGS: Brain: Beam hardening artifacts from jewelry at the ears. Normal ventricular morphology. No midline shift or mass effect. Mild small vessel chronic ischemic changes of periventricular white matter. Otherwise normal appearance of brain parenchyma. No intracranial hemorrhage, mass lesion, or evidence of acute infarction. No extra-axial fluid collections. Vascular: No hyperdense vessels Skull: Intact Sinuses/Orbits: Clear Other: N/A IMPRESSION: Minimal periventricular white matter small vessel chronic ischemic changes. No acute intracranial abnormalities. Electronically Signed   By: Lavonia Dana M.D.   On: 11/04/2017 17:32    Procedures Procedures (including critical care time)  Medications Ordered in  ED Medications  0.9 %  sodium chloride infusion (has no administration in time range)  cefTRIAXone (ROCEPHIN) 1 g in sodium chloride 0.9 % 100 mL IVPB (0 g Intravenous Stopped 11/04/17 1753)  sodium chloride 0.9 % bolus 1,000 mL (1,000 mLs Intravenous New Bag/Given 11/04/17 1641)     Initial Impression / Assessment and Plan / ED Course  I have reviewed the triage vital signs and the nursing notes.  Pertinent labs & imaging results that were available during my care of the patient were reviewed by me and considered in my medical decision making (see chart for details).    Work-up positive for probable urinary tract infection with positive nitrite.  This could explain the fever chills and diaphoresis and the nausea and vomiting.  Could be early pyelonephritis.  Do not think it explains her symptoms from a week ago.  Rest of work-up negative chest x-ray negative head CT negative.  Labs without significant abnormalities one thing of interest is that there was no well leukocytosis.  Actually white blood cell count was on the low side more suggestive of viral process.  So it could be 2 things ongoing.  Patient received 1 g of Rocephin here for the urinary tract infection.  Urinalysis sent for culture.  And is pending.  Patient will be continued on Keflex and Phenergan.  Patient after IV hydration and some food here feels much better.  Do not feel that patient had a stroke symptoms may have been due to anxiety could have also been a reaction from the recent multiple episodes of vomiting.  Symptoms were bilateral.   Final Clinical Impressions(s) / ED Diagnoses   Final diagnoses:  Acute cystitis without hematuria    ED Discharge Orders        Ordered    cephALEXin (KEFLEX) 500 MG capsule  4 times daily     11/04/17 1811    promethazine (PHENERGAN) 25 MG tablet  Every 6 hours PRN     11/04/17 1811       Fredia Sorrow, MD 11/04/17 1819

## 2017-11-04 NOTE — ED Triage Notes (Signed)
Pt from home by [facility] for fever and weakness x week.  Pt had nausea and one time emesis today. Pt had numbness and tingling today and told EMS she thought she had a stroke, pt was negative for EMS stroke scale. Vitals: 156/100, HR 80, 100 on 2L via n/c for comfort. 99% on room air.  Pt still feels anxious now.

## 2017-11-04 NOTE — ED Notes (Signed)
Patient given meal tray.

## 2017-11-04 NOTE — ED Notes (Signed)
Transporting to radiology.

## 2017-11-07 LAB — URINE CULTURE

## 2017-11-08 ENCOUNTER — Telehealth: Payer: Self-pay

## 2017-11-08 NOTE — Telephone Encounter (Signed)
Post ED Visit - Positive Culture Follow-up  Culture report reviewed by antimicrobial stewardship pharmacist:  []  Elenor Quinones, Pharm.D. []  Heide Guile, Pharm.D., BCPS AQ-ID []  Parks Neptune, Pharm.D., BCPS []  Alycia Rossetti, Pharm.D., BCPS []  [place], Pharm.D., BCPS, AAHIVP []  Legrand Como, Pharm.D., BCPS, AAHIVP [x]  Salome Arnt, PharmD, BCPS []  Wynell Balloon, PharmD []  Vincenza Hews, PharmD, BCPS  Positive urine culture Treated with Cephalexin, organism sensitive to the same and no further patient follow-up is required at this time.  Genia Del 11/08/2017, 11:45 AM

## 2018-10-06 DIAGNOSIS — F3162 Bipolar disorder, current episode mixed, moderate: Secondary | ICD-10-CM | POA: Insufficient documentation

## 2019-03-06 DIAGNOSIS — F321 Major depressive disorder, single episode, moderate: Secondary | ICD-10-CM | POA: Insufficient documentation

## 2019-03-06 DIAGNOSIS — M2550 Pain in unspecified joint: Secondary | ICD-10-CM | POA: Insufficient documentation

## 2019-09-29 ENCOUNTER — Other Ambulatory Visit (HOSPITAL_COMMUNITY): Payer: Self-pay | Admitting: Family Medicine

## 2019-09-29 DIAGNOSIS — Z0000 Encounter for general adult medical examination without abnormal findings: Secondary | ICD-10-CM | POA: Insufficient documentation

## 2020-01-23 ENCOUNTER — Other Ambulatory Visit: Payer: Self-pay

## 2020-01-23 ENCOUNTER — Emergency Department (HOSPITAL_BASED_OUTPATIENT_CLINIC_OR_DEPARTMENT_OTHER): Payer: BC Managed Care – PPO

## 2020-01-23 ENCOUNTER — Emergency Department (HOSPITAL_BASED_OUTPATIENT_CLINIC_OR_DEPARTMENT_OTHER)
Admission: EM | Admit: 2020-01-23 | Discharge: 2020-01-24 | Disposition: A | Discharge status: Home / Self Care | Payer: BC Managed Care – PPO | Attending: Emergency Medicine | Admitting: Emergency Medicine

## 2020-01-23 ENCOUNTER — Encounter (HOSPITAL_BASED_OUTPATIENT_CLINIC_OR_DEPARTMENT_OTHER): Payer: Self-pay | Admitting: *Deleted

## 2020-01-23 DIAGNOSIS — Z86012 Personal history of benign carcinoid tumor: Secondary | ICD-10-CM | POA: Insufficient documentation

## 2020-01-23 DIAGNOSIS — S92331A Displaced fracture of third metatarsal bone, right foot, initial encounter for closed fracture: Secondary | ICD-10-CM | POA: Diagnosis not present

## 2020-01-23 DIAGNOSIS — S62617A Displaced fracture of proximal phalanx of left little finger, initial encounter for closed fracture: Secondary | ICD-10-CM | POA: Diagnosis not present

## 2020-01-23 DIAGNOSIS — Y929 Unspecified place or not applicable: Secondary | ICD-10-CM | POA: Diagnosis not present

## 2020-01-23 DIAGNOSIS — Y939 Activity, unspecified: Secondary | ICD-10-CM | POA: Insufficient documentation

## 2020-01-23 DIAGNOSIS — Y999 Unspecified external cause status: Secondary | ICD-10-CM | POA: Insufficient documentation

## 2020-01-23 DIAGNOSIS — Z79899 Other long term (current) drug therapy: Secondary | ICD-10-CM | POA: Diagnosis not present

## 2020-01-23 DIAGNOSIS — S92902A Unspecified fracture of left foot, initial encounter for closed fracture: Secondary | ICD-10-CM

## 2020-01-23 DIAGNOSIS — S99922A Unspecified injury of left foot, initial encounter: Secondary | ICD-10-CM | POA: Diagnosis present
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
## 2020-01-24 ENCOUNTER — Encounter (HOSPITAL_BASED_OUTPATIENT_CLINIC_OR_DEPARTMENT_OTHER): Payer: Self-pay | Admitting: Emergency Medicine

## 2020-01-24 MED ORDER — DICLOFENAC SODIUM ER 100 MG PO TB24: 100.0000 mg | ORAL_TABLET | Freq: Every day | ORAL | 0 refills | Status: DC

## 2020-01-24 MED ORDER — KETOROLAC TROMETHAMINE 60 MG/2ML IM SOLN
60.0000 mg | Freq: Once | INTRAMUSCULAR | Status: AC
  Administered 2020-01-24: 60 mg via INTRAMUSCULAR
  Filled 2020-01-24: qty 2
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
## 2020-04-04 ENCOUNTER — Other Ambulatory Visit (HOSPITAL_COMMUNITY): Payer: Self-pay | Admitting: Family Medicine

## 2020-04-10 ENCOUNTER — Other Ambulatory Visit (HOSPITAL_COMMUNITY): Payer: Self-pay | Admitting: Student

## 2020-04-16 ENCOUNTER — Other Ambulatory Visit (HOSPITAL_COMMUNITY): Payer: Self-pay | Admitting: Family Medicine

## 2020-04-17 ENCOUNTER — Other Ambulatory Visit (HOSPITAL_COMMUNITY): Payer: Self-pay | Admitting: Family Medicine

## 2020-06-04 ENCOUNTER — Other Ambulatory Visit (HOSPITAL_COMMUNITY): Payer: Self-pay | Admitting: Family Medicine

## 2020-07-09 ENCOUNTER — Other Ambulatory Visit (HOSPITAL_COMMUNITY): Payer: Self-pay | Admitting: Family Medicine

## 2020-07-19 ENCOUNTER — Other Ambulatory Visit (HOSPITAL_COMMUNITY): Payer: Self-pay | Admitting: Family Medicine

## 2020-08-06 ENCOUNTER — Other Ambulatory Visit (HOSPITAL_COMMUNITY): Payer: Self-pay | Admitting: Family Medicine

## 2020-08-20 ENCOUNTER — Other Ambulatory Visit (HOSPITAL_COMMUNITY): Payer: Self-pay | Admitting: Family Medicine

## 2020-09-05 MED FILL — Alprazolam Tab 1 MG: ORAL | 30 days supply | Qty: 90 | Fill #0 | Status: AC

## 2020-09-06 ENCOUNTER — Other Ambulatory Visit (HOSPITAL_COMMUNITY): Payer: Self-pay

## 2020-10-04 ENCOUNTER — Other Ambulatory Visit (HOSPITAL_COMMUNITY): Payer: Self-pay

## 2020-10-04 MED ORDER — VALACYCLOVIR HCL 500 MG PO TABS
ORAL_TABLET | ORAL | 3 refills | Status: DC
  Filled 2020-10-04: qty 30, 30d supply, fill #0
  Filled 2020-11-05: qty 30, 30d supply, fill #1
  Filled 2020-12-03: qty 30, 30d supply, fill #2
  Filled 2020-12-27: qty 30, 30d supply, fill #3
  Filled 2020-12-28: qty 30, 30d supply, fill #0
  Filled 2021-03-06: qty 30, 30d supply, fill #1
  Filled 2021-04-04: qty 30, 30d supply, fill #2
  Filled 2021-05-02: qty 30, 30d supply, fill #3
  Filled 2021-06-06: qty 30, 30d supply, fill #4
  Filled 2021-07-11: qty 30, 30d supply, fill #5
  Filled 2021-09-27: qty 30, 30d supply, fill #6
  Filled 2021-10-02: qty 30, 30d supply, fill #7

## 2020-10-04 MED FILL — Duloxetine HCl Enteric Coated Pellets Cap 30 MG (Base Eq): ORAL | 30 days supply | Qty: 30 | Fill #0 | Status: AC

## 2020-10-05 ENCOUNTER — Other Ambulatory Visit (HOSPITAL_COMMUNITY): Payer: Self-pay

## 2020-10-05 MED ORDER — ALPRAZOLAM 1 MG PO TABS
ORAL_TABLET | ORAL | 1 refills | Status: DC
  Filled 2020-10-05: qty 90, 30d supply, fill #0
  Filled 2020-11-05: qty 90, 30d supply, fill #1

## 2020-10-11 ENCOUNTER — Other Ambulatory Visit (HOSPITAL_COMMUNITY): Payer: Self-pay

## 2020-10-11 MED ORDER — DULOXETINE HCL 60 MG PO CPEP
60.0000 mg | ORAL_CAPSULE | Freq: Every day | ORAL | 2 refills | Status: AC
  Filled 2020-10-18: qty 30, 30d supply, fill #0
  Filled 2020-11-26: qty 30, 30d supply, fill #1
  Filled 2020-12-27: qty 30, 30d supply, fill #2
  Filled 2020-12-28: qty 30, 30d supply, fill #0
  Filled 2021-01-25: qty 30, 30d supply, fill #1
  Filled 2021-03-06: qty 30, 30d supply, fill #2
  Filled 2021-04-04: qty 30, 30d supply, fill #3
  Filled 2021-05-02: qty 30, 30d supply, fill #4
  Filled 2021-06-06: qty 30, 30d supply, fill #5
  Filled 2021-07-11: qty 30, 30d supply, fill #6

## 2020-10-18 ENCOUNTER — Other Ambulatory Visit (HOSPITAL_COMMUNITY): Payer: Self-pay

## 2020-10-18 MED ORDER — METOPROLOL SUCCINATE ER 25 MG PO TB24
1.0000 | ORAL_TABLET | Freq: Every day | ORAL | 2 refills | Status: DC
  Filled 2020-10-18: qty 30, 30d supply, fill #0
  Filled 2020-11-26: qty 30, 30d supply, fill #1
  Filled 2020-12-27: qty 30, 30d supply, fill #2
  Filled 2020-12-28: qty 30, 30d supply, fill #0

## 2020-11-05 ENCOUNTER — Other Ambulatory Visit (HOSPITAL_COMMUNITY): Payer: Self-pay

## 2020-11-05 MED ORDER — MELOXICAM 15 MG PO TABS
ORAL_TABLET | ORAL | 3 refills | Status: DC
  Filled 2020-11-05: qty 30, 30d supply, fill #0
  Filled 2020-12-03: qty 30, 30d supply, fill #1
  Filled 2020-12-27: qty 30, 30d supply, fill #2
  Filled 2020-12-28: qty 30, 30d supply, fill #0
  Filled 2021-01-25: qty 30, 30d supply, fill #1
  Filled 2021-04-04: qty 30, 30d supply, fill #2
  Filled 2021-05-02: qty 30, 30d supply, fill #3
  Filled 2021-06-06: qty 30, 30d supply, fill #4
  Filled 2021-07-11: qty 30, 30d supply, fill #5
  Filled 2021-09-27: qty 30, 30d supply, fill #6
  Filled 2021-10-28: qty 30, 30d supply, fill #7

## 2020-11-06 ENCOUNTER — Other Ambulatory Visit: Payer: Self-pay

## 2020-11-06 ENCOUNTER — Encounter (HOSPITAL_COMMUNITY): Payer: Self-pay

## 2020-11-06 ENCOUNTER — Emergency Department (HOSPITAL_COMMUNITY)
Admission: EM | Admit: 2020-11-06 | Discharge: 2020-11-07 | Disposition: A | Discharge status: Home / Self Care | Payer: BC Managed Care – PPO | Attending: Emergency Medicine | Admitting: Emergency Medicine

## 2020-11-06 ENCOUNTER — Emergency Department (HOSPITAL_COMMUNITY): Payer: BC Managed Care – PPO

## 2020-11-06 DIAGNOSIS — I880 Nonspecific mesenteric lymphadenitis: Secondary | ICD-10-CM | POA: Diagnosis not present

## 2020-11-06 DIAGNOSIS — R1032 Left lower quadrant pain: Secondary | ICD-10-CM | POA: Diagnosis present

## 2020-11-06 LAB — CBC WITH DIFFERENTIAL/PLATELET
Abs Immature Granulocytes: 0.03 K/uL (ref 0.00–0.07)
Basophils Absolute: 0.1 K/uL (ref 0.0–0.1)
Basophils Relative: 1 %
Eosinophils Absolute: 0.2 K/uL (ref 0.0–0.5)
Eosinophils Relative: 2 %
HCT: 43.7 % (ref 36.0–46.0)
Hemoglobin: 14.4 g/dL (ref 12.0–15.0)
Immature Granulocytes: 0 %
Lymphocytes Relative: 25 %
Lymphs Abs: 2.5 K/uL (ref 0.7–4.0)
MCH: 29.4 pg (ref 26.0–34.0)
MCHC: 33 g/dL (ref 30.0–36.0)
MCV: 89.2 fL (ref 80.0–100.0)
Monocytes Absolute: 0.6 K/uL (ref 0.1–1.0)
Monocytes Relative: 6 %
Neutro Abs: 6.7 K/uL (ref 1.7–7.7)
Neutrophils Relative %: 66 %
Platelets: 296 K/uL (ref 150–400)
RBC: 4.9 MIL/uL (ref 3.87–5.11)
RDW: 13.2 % (ref 11.5–15.5)
WBC: 10.1 K/uL (ref 4.0–10.5)
nRBC: 0 % (ref 0.0–0.2)

## 2020-11-06 LAB — COMPREHENSIVE METABOLIC PANEL WITH GFR
ALT: 13 U/L (ref 0–44)
AST: 19 U/L (ref 15–41)
Albumin: 4.5 g/dL (ref 3.5–5.0)
Alkaline Phosphatase: 113 U/L (ref 38–126)
Anion gap: 8 (ref 5–15)
BUN: 9 mg/dL (ref 6–20)
CO2: 26 mmol/L (ref 22–32)
Calcium: 8.8 mg/dL — ABNORMAL LOW (ref 8.9–10.3)
Chloride: 102 mmol/L (ref 98–111)
Creatinine, Ser: 0.8 mg/dL (ref 0.44–1.00)
GFR, Estimated: >60 mL/min
Glucose, Bld: 114 mg/dL — ABNORMAL HIGH (ref 70–99)
Potassium: 4.1 mmol/L (ref 3.5–5.1)
Sodium: 136 mmol/L (ref 135–145)
Total Bilirubin: 0.5 mg/dL (ref 0.3–1.2)
Total Protein: 7.8 g/dL (ref 6.5–8.1)

## 2020-11-06 LAB — LIPASE, BLOOD: Lipase: 29 U/L (ref 11–51)

## 2020-11-06 MED ORDER — IOHEXOL 300 MG/ML  SOLN
100.0000 mL | Freq: Once | INTRAMUSCULAR | Status: AC | PRN
  Administered 2020-11-06: 100 mL via INTRAVENOUS

## 2020-11-06 MED ORDER — HYDROMORPHONE HCL 1 MG/ML IJ SOLN
0.5000 mg | Freq: Once | INTRAMUSCULAR | Status: AC
  Administered 2020-11-06: 0.5 mg via INTRAVENOUS
  Filled 2020-11-06: qty 1

## 2020-11-06 MED ORDER — KETOROLAC TROMETHAMINE 30 MG/ML IJ SOLN
30.0000 mg | Freq: Once | INTRAMUSCULAR | Status: AC
  Administered 2020-11-06: 30 mg via INTRAVENOUS
  Filled 2020-11-06: qty 1

## 2020-11-06 NOTE — ED Provider Notes (Signed)
Emergency Medicine Provider Triage Evaluation Note  Bonnie Russell , a 55 y.o. female  was evaluated in triage.  Pt complains of left flank pain, 2 large bowel movements Saturday, feeling better on Sunday.Pain returned last night, worse laying on right side, improves lying on left side and applying pressure. Reports nausea, no vomiting, no fevers.  Sees Dr. Watt Climes, colonoscopy last July normal.  Prior history ectopic x 3 Review of Systems  Positive: Abdominal pain Negative: Fever, vomiting, blood in stools  Physical Exam  There were no vitals taken for this visit. Gen:   Awake, no distress   Resp:  Normal effort  MSK:   Moves extremities without difficulty  Other:  No CVA tenderness, no abdominal pain with seated exam  Medical Decision Making  Medically screening exam initiated at 2:13 PM.  Appropriate orders placed.  Bonnie Russell was informed that the remainder of the evaluation will be completed by another provider, this initial triage assessment does not replace that evaluation, and the importance of remaining in the ED until their evaluation is complete.     Tacy Learn, PA-C 11/06/20 [place], [place], MD 11/07/20 [phone]

## 2020-11-06 NOTE — ED Triage Notes (Signed)
Patient c/o intermittent left abdominal pain x 8 days. Patient states she had 2 large BM's 3 days ago. Patient also c/o nausea, but denies diarrhea or vomiting.

## 2020-11-06 NOTE — ED Provider Notes (Signed)
[facility] DEPT Provider Note   CSN: [id number] Arrival date & time: 11/06/20  1321     History Chief Complaint  Patient presents with   Flank Pain    Bonnie Russell is a 55 y.o. female presenting to emergency department abdominal pain.  She reports gradual onset of pain approximately 1-1/2 weeks ago.  Is located in her left lower quadrant.  She has never had it before.  The pain is waxed and waned and gone away for a period of time, but then returned.  It intensified 2 days ago.  It is currently 7 out of 10.  She has normal appetite, reports belching but denies vomiting.  She is having regular bowel movements.  She denies diarrhea or constipation.  She denies dysuria, hematuria, history of kidney stones.  She reports she had a normal colonoscopy done 1 year ago with Dr. Watt Climes, as there is a family history of colon cancer in her mother.  She also has a history of endometriosis and is perimenopausal.   HPI     Past Medical History:  Diagnosis Date   Anxiety    Contact lens/glasses fitting    wears contacts or glasses   Depression     Patient Active Problem List   Diagnosis Date Noted   Intraductal papilloma of breast 12/09/2012    Past Surgical History:  Procedure Laterality Date   BREAST BIOPSY Left 03/03/2013   Procedure:  NEEDLE LOCALIZATION REMOVE LEFT BREAST LUMP;  Surgeon: Haywood Lasso, MD;  Location: [facility];  Service: General;  Laterality: Left;   [place], 2001   INNER EAR SURGERY  as child   x 2     OB History   No obstetric history on file.     Family History  Problem Relation Age of Onset   Throat cancer Paternal Grandfather     Social History   Tobacco Use   Smoking status: Never   Smokeless tobacco: Never  Vaping Use   Vaping Use: Never used  Substance Use Topics   Alcohol use: Yes    Comment: 2-3 beers/wine weekly   Drug use: No    Home  Medications Prior to Admission medications   Medication Sig Start Date End Date Taking? Authorizing Provider  amoxicillin-clavulanate (AUGMENTIN) 875-125 MG tablet Take 1 tablet by mouth every 12 (twelve) hours for 7 days. 11/07/20 11/14/20 Yes Hayato Guaman, Carola Rhine, MD  ibuprofen (ADVIL) 600 MG tablet Take 1 tablet (600 mg total) by mouth every 6 (six) hours as needed for up to 30 doses for mild pain or moderate pain. 11/07/20  Yes Cortlan Dolin, Carola Rhine, MD  ALPRAZolam Duanne Moron) 1 MG tablet Take 1 mg by mouth at bedtime as needed for sleep.    [provider]  ALPRAZolam (XANAX) 1 MG tablet TAKE 1/2 TO 1 TABLET BY MOUTH 3 TIMES DAILY AS NEEDED FOR ANXIETY. 07/09/20 01/05/21  Lazoff, Shawn P, DO  ALPRAZolam (XANAX) 1 MG tablet TAKE 1/2 TO 1 TABLET BY MOUTH 3 TIMES DAILY AS NEEDED FOR ANXIETY. 06/04/20 12/01/20  Lazoff, Shawn P, DO  ALPRAZolam (XANAX) 1 MG tablet TAKE 1/2 TO 1 TABLET BY MOUTH 3 TIMES DAILY AS NEEDED FOR ANXIETY. 10/04/20     amphetamine-dextroamphetamine (ADDERALL) 20 MG tablet TAKE 1 TABLET BY MOUTH TWO TIMES DAILY AS NEEDED FOR ADD 04/04/20 10/01/20  Christain Sacramento, MD  buPROPion [facility] SR) 150 MG 12 hr tablet Take 150 mg by mouth 2 (  two) times daily. 08/03/17   [provider]  cephALEXin (KEFLEX) 500 MG capsule Take 1 capsule (500 mg total) by mouth 4 (four) times daily. 11/04/17   Fredia Sorrow, MD  citalopram (CELEXA) 40 MG tablet Take 40 mg by mouth daily.     [provider]  Diclofenac Sodium CR 100 MG 24 hr tablet Take 1 tablet (100 mg total) by mouth daily. 01/24/20   Palumbo, April, MD  DULoxetine (CYMBALTA) 30 MG capsule TAKE 1 CAPSULE (30 MG TOTAL) BY MOUTH DAILY. 08/20/20 08/20/21  Lazoff, Shawn P, DO  DULoxetine (CYMBALTA) 30 MG capsule TAKE 1 CAPSULE (30 MG TOTAL) BY MOUTH DAILY. 07/19/20 07/19/21  Lazoff, Shawn P, DO  DULoxetine (CYMBALTA) 30 MG capsule TAKE 1 CAPSULE (30 MG TOTAL) BY MOUTH DAILY. 04/16/20 04/16/21  Lazoff, Shawn P, DO  DULoxetine (CYMBALTA)  60 MG capsule Take 1 capsule (60 mg total) by mouth daily. 10/10/20     gabapentin (NEURONTIN) 300 MG capsule TAKE 1 CAPSULE BY MOUTH 3 TIMES DAILY 04/10/20 04/10/21  Meyran, Ocie Cornfield, NP  meloxicam (MOBIC) 15 MG tablet Take 15 mg by mouth daily. 08/03/17   [provider]  meloxicam (MOBIC) 15 MG tablet TAKE 1 TABLET BY MOUTH DAILY AS NEEDED WITH FOOD FOR ARTHRITIS PAIN 11/05/20     metoprolol succinate (TOPROL-XL) 25 MG 24 hr tablet Take 1 tablet (25 mg total) by mouth at bedtime. 10/18/20     Phenyleph-Doxylamine-DM-APAP (NYQUIL SEVERE COLD/FLU PO) Take 1 Dose by mouth every 4 (four) hours.    [provider]  promethazine (PHENERGAN) 25 MG tablet Take 1 tablet (25 mg total) by mouth every 6 (six) hours as needed for nausea or vomiting. 11/04/17   Fredia Sorrow, MD  QUEtiapine (SEROQUEL XR) 200 MG 24 hr tablet TAKE 1 TABLET BY MOUTH DAY AT BEDTIME. 09/29/19 09/28/20  Christain Sacramento, MD  valACYclovir (VALTREX) 500 MG tablet Take 500 mg by mouth daily.     [provider]  valACYclovir (VALTREX) 500 MG tablet Take 1 tablet by mouth daily as a preventative 10/04/20       Allergies    Morphine and related and Sertraline  Review of Systems   Review of Systems  Constitutional:  Negative for chills and fever.  Eyes:  Negative for pain and visual disturbance.  Respiratory:  Negative for cough and shortness of breath.   Cardiovascular:  Negative for chest pain and palpitations.  Gastrointestinal:  Positive for abdominal pain. Negative for constipation, diarrhea, nausea and vomiting.  Genitourinary:  Positive for flank pain. Negative for dysuria and hematuria.  Musculoskeletal:  Positive for back pain. Negative for arthralgias.  Skin:  Negative for color change and rash.  Neurological:  Negative for syncope and headaches.  All other systems reviewed and are negative.  Physical Exam Updated Vital Signs BP (!) 160/119   Pulse 94   Temp 97.7 F (36.5 C) (Oral)    Resp 16   Ht 5\' 4"  (1.626 m)   Wt 97.5 kg   LMP 10/02/2020 (Approximate)   SpO2 98%   BMI 36.90 kg/m   Physical Exam Constitutional:      General: She is not in acute distress. HENT:     Head: Normocephalic and atraumatic.  Eyes:     Conjunctiva/sclera: Conjunctivae normal.     Pupils: Pupils are equal, round, and reactive to light.  Cardiovascular:     Rate and Rhythm: Normal rate and regular rhythm.  Pulmonary:     Effort:  Pulmonary effort is normal. No respiratory distress.  Abdominal:     General: There is no distension.     Tenderness: There is abdominal tenderness in the suprapubic area and left lower quadrant. There is no guarding or rebound. Negative signs include Murphy's sign and McBurney's sign.  Skin:    General: Skin is warm and dry.  Neurological:     General: No focal deficit present.     Mental Status: She is alert. Mental status is at baseline.  Psychiatric:        Mood and Affect: Mood normal.        Behavior: Behavior normal.    ED Results / Procedures / Treatments   Labs (all labs ordered are listed, but only abnormal results are displayed) Labs Reviewed  COMPREHENSIVE METABOLIC PANEL - Abnormal; Notable for the following components:      Result Value   Glucose, Bld 114 (*)    Calcium 8.8 (*)    All other components within normal limits  URINALYSIS, ROUTINE W REFLEX MICROSCOPIC - Abnormal; Notable for the following components:   Specific Gravity, Urine >1.046 (*)    All other components within normal limits  CBC WITH DIFFERENTIAL/PLATELET  LIPASE, BLOOD    EKG None  Radiology CT ABDOMEN PELVIS W CONTRAST  Result Date: 11/06/2020 CLINICAL DATA:  Diverticulitis suspected, left lower quadrant abdominal pain for week EXAM: CT ABDOMEN AND PELVIS WITH CONTRAST TECHNIQUE: Multidetector CT imaging of the abdomen and pelvis was performed using the standard protocol following bolus administration of intravenous contrast. CONTRAST:  150mL OMNIPAQUE  IOHEXOL 300 MG/ML  SOLN COMPARISON:  None. FINDINGS: Lower chest: Lung bases are clear. Normal heart size. No pericardial effusion. Hepatobiliary: Diffuse hepatic hypoattenuation compatible with hepatic steatosis. Smooth surface contour. No concerning focal liver lesion. Normal gallbladder and biliary tree. Pancreas: No pancreatic ductal dilatation or surrounding inflammatory changes. Spleen: Normal in size. No concerning splenic lesions. Adrenals/Urinary Tract: Normal adrenal glands. Kidneys are normally located with symmetric enhancement and excretion. No suspicious renal lesion, urolithiasis or hydronephrosis. Urinary bladder is unremarkable. Stomach/Bowel: Distal esophagus, stomach and duodenum are free of acute abnormality. No small bowel thickening or dilatation. Appendix is not visualized. No focal inflammation the vicinity of the cecum to suggest an occult appendicitis. Moderate colonic stool burden. No colonic dilatation or wall thickening. Vascular/Lymphatic: Minimal atherosclerotic calcification. No other significant vascular findings. Borderline enlarged 14 mm lymph node in right inguinal chain. Focal region of mid mesenteric hazy stranding with numerous reactive appearing clustered mid mesenteric lymph nodes compatible with mesenteritis (2/47). No other suspicious or enlarged lymph nodes in the included lymphatic chains. Reproductive: Retroverted uterus. Endometrial thickness of 10 mm, correlate with menstrual status. No concerning adnexal lesions. Other: Mild posterior body wall edema. No abdominopelvic free fluid or free gas. No bowel containing hernias. Tiny fat containing umbilical hernia. Musculoskeletal: Grade 2 anterolisthesis L5 on S1 with maximal degenerative changes at this level. Associated bilateral L5 pars defects. Additional multilevel degenerative changes elsewhere in the imaged spine. Mild degenerative changes in the bilateral hips. No acute osseous abnormality or suspicious osseous  lesion. IMPRESSION: 1. Central region of focal mesenteric hazy stranding with some reactive appearing lymph nodes, consistent with mesenteritis. 2. No other CT abnormality provide a cause for patient's symptoms. Specifically no evidence of diverticulitis or acute urinary tract abnormality. 3. Endometrial thickening to 10 mm, within normal limits for a premenopausal/perimenopausal female. If patient is postmenopausal, consider pelvic ultrasound on a nonemergent basis. 4. Borderline enlarged 14 mm right  inguinal node, possibly reactive. If palpable, may be followed on a clinical basis with repeat imaging if this fails to resolve or enlarges. 5.  Aortic Atherosclerosis (ICD10-I70.0). Electronically Signed   By: Lovena Le M.D.   On: 11/06/2020 23:53    Procedures Procedures   Medications Ordered in ED Medications  HYDROmorphone (DILAUDID) injection 0.5 mg (0.5 mg Intravenous Given 11/06/20 2223)  ketorolac (TORADOL) 30 MG/ML injection 30 mg (30 mg Intravenous Given 11/06/20 2223)  iohexol (OMNIPAQUE) 300 MG/ML solution 100 mL (100 mLs Intravenous Contrast Given 11/06/20 2329)  HYDROmorphone (DILAUDID) injection 1 mg (1 mg Intravenous Given 11/07/20 0055)  dexamethasone (DECADRON) injection 6 mg (6 mg Intravenous Given 11/07/20 0055)  amoxicillin-clavulanate (AUGMENTIN) 875-125 MG per tablet 1 tablet (1 tablet Oral Given 11/07/20 0055)    ED Course  I have reviewed the triage vital signs and the nursing notes.  Pertinent labs & imaging results that were available during my care of the patient were reviewed by me and considered in my medical decision making (see chart for details).  This patient presents to the Emergency Department with complaint of abdominal pain. This involves an extensive number of treatment options, and is a complaint that carries with it a high risk of complications and morbidity.  The differential diagnosis includes, but is not limited to, gastritis vs biliary disease vs peptic  ulcer vs constipation vs colitis vs UTI vs other  I ordered, reviewed, and interpreted labs, including CMP, CBC, and lipase.There were no immediate, life-threatening emergencies found in this labwork.  Patient's UA showed no signs of infection I ordered medication IV dilaudid, IV toradol or abdominal pain and/or nausea I ordered imaging studies which included CT abdomen pelvis with contrast I independently visualized and interpreted imaging and agree with the summary provided below  (see ED course)   After the interventions stated above, I reevaluated the patient and found that they remained clinically stable.  Based on the patient's clinical exam, vital signs, risk factors, and ED testing, I felt that the patient's overall risk of life-threatening emergency such as bowel perforation, surgical emergency, or sepsis was quite low.  I suspect this clinical presentation is most consistent with mesenteric adenitis, but explained to the patient that this evaluation was not a definitive diagnostic workup.  I discussed outpatient follow up with primary care provider, and provided specialist office number on the patient's discharge paper if a referral was deemed necessary.  I discussed return precautions with the patient. I felt the patient was clinically stable for discharge.  *  Started on PO augmentin to cover for colitis - gave decadron & started on NSAIDs for lymph node inflammation.  Discussed endometrial thickening - she has f/u appointment scheduled with OBGYN - she will likely need repeat ultrasound imaging.  We also discussed the differential for her lymphadenopathy including malignancy (although I do not see evidence of cancer on her CT, and she reports a normal colonoscopy 1 year ago, and gets regular pap smears with her OBGYN).  She will need to f/u on this issue if she is not getting better after her treatment.   Clinical Course as of 11/07/20 0930  Tue Nov 06, 2020  2357 IMPRESSION: 1.  Central region of focal mesenteric hazy stranding with some reactive appearing lymph nodes, consistent with mesenteritis. 2. No other CT abnormality provide a cause for patient's symptoms. Specifically no evidence of diverticulitis or acute urinary tract abnormality. 3. Endometrial thickening to 10 mm, within normal limits for a premenopausal/perimenopausal  female. If patient is postmenopausal, consider pelvic ultrasound on a nonemergent basis. 4. Borderline enlarged 14 mm right inguinal node, possibly reactive. If palpable, may be followed on a clinical basis with repeat imaging if this fails to resolve or enlarges. 5.  Aortic Atherosclerosis (ICD10-I70.0). [MT]    Clinical Course User Index [MT] Wyvonnia Dusky, MD    Final Clinical Impression(s) / ED Diagnoses Final diagnoses:  Mesenteric adenitis  Left lower quadrant abdominal pain    Rx / DC Orders ED Discharge Orders          Ordered    amoxicillin-clavulanate (AUGMENTIN) 875-125 MG tablet  Every 12 hours        11/07/20 0044    ibuprofen (ADVIL) 600 MG tablet  Every 6 hours PRN        11/07/20 0044             Wyvonnia Dusky, MD 11/07/20 0930

## 2020-11-07 ENCOUNTER — Other Ambulatory Visit (HOSPITAL_COMMUNITY): Payer: Self-pay

## 2020-11-07 LAB — URINALYSIS, ROUTINE W REFLEX MICROSCOPIC
Bilirubin Urine: NEGATIVE
Glucose, UA: NEGATIVE mg/dL
Hgb urine dipstick: NEGATIVE
Ketones, ur: NEGATIVE mg/dL
Leukocytes,Ua: NEGATIVE
Nitrite: NEGATIVE
Protein, ur: NEGATIVE mg/dL
Specific Gravity, Urine: >1.046 — ABNORMAL HIGH (ref 1.005–1.030)
pH: 6 (ref 5.0–8.0)

## 2020-11-07 MED ORDER — AMOXICILLIN-POT CLAVULANATE 875-125 MG PO TABS
1.0000 | ORAL_TABLET | Freq: Once | ORAL | Status: AC
  Administered 2020-11-07: 1 via ORAL
  Filled 2020-11-07: qty 1

## 2020-11-07 MED ORDER — AMOXICILLIN-POT CLAVULANATE 875-125 MG PO TABS: 1.0000 | ORAL_TABLET | Freq: Two times a day (BID) | ORAL | 0 refills | Status: AC

## 2020-11-07 MED ORDER — IBUPROFEN 600 MG PO TABS
ORAL_TABLET | ORAL | 0 refills | Status: DC
  Filled 2020-11-07: qty 30, 8d supply, fill #0

## 2020-11-07 MED ORDER — HYDROMORPHONE HCL 1 MG/ML IJ SOLN
1.0000 mg | Freq: Once | INTRAMUSCULAR | Status: AC
  Administered 2020-11-07: 1 mg via INTRAVENOUS
  Filled 2020-11-07: qty 1

## 2020-11-07 MED ORDER — IBUPROFEN 600 MG PO TABS: 600.0000 mg | ORAL_TABLET | Freq: Four times a day (QID) | ORAL | 0 refills | Status: DC | PRN

## 2020-11-07 MED ORDER — AMOXICILLIN-POT CLAVULANATE 875-125 MG PO TABS
ORAL_TABLET | ORAL | 0 refills | Status: DC
  Filled 2020-11-07: qty 14, 7d supply, fill #0

## 2020-11-07 MED ORDER — DEXAMETHASONE SODIUM PHOSPHATE 10 MG/ML IJ SOLN
6.0000 mg | Freq: Once | INTRAMUSCULAR | Status: AC
  Administered 2020-11-07: 6 mg via INTRAVENOUS
  Filled 2020-11-07: qty 1

## 2020-11-07 NOTE — Discharge Instructions (Addendum)
Your CT scan today showed that you have some reactive and inflamed lymph nodes in your abdomen.  This may be caused by several things, including an infection as the most common.  Therefore I decided to start you on antibiotics called Augmentin.  You will take these with anti-inflammatory medicine Ibuprofen for 7 days.  Try to take his medications together and with some food if possible.  Augmentin can cause stomach upset and nausea and about 50% of people.  It can also cause loose bowel movements.  You were somewhat constipated on your CT scan, and would benefit from a bowel cleanout.  We gave you a shot of Decadron, which is a steroid in the ER today.  This will likely begin taking effect over the next 24 hours.  Again, this is intended to improve the inflammation in your belly.  Another possibility for your swollen lymph nodes would be malignancy or a cancer.  This is less likely at this time, but I advised you follow up with your OBGYN provider or PCP if you continue having abdominal pain after completing treatment (in about 2 weeks).  Your CT scan showed that your uterus was thickened, and you will likely need another ultrasound of your pelvis from your OBGYN provider.

## 2020-11-27 ENCOUNTER — Other Ambulatory Visit (HOSPITAL_COMMUNITY): Payer: Self-pay

## 2020-12-03 ENCOUNTER — Other Ambulatory Visit (HOSPITAL_COMMUNITY): Payer: Self-pay

## 2020-12-04 ENCOUNTER — Other Ambulatory Visit (HOSPITAL_COMMUNITY): Payer: Self-pay

## 2020-12-04 MED ORDER — ALPRAZOLAM 1 MG PO TABS
ORAL_TABLET | ORAL | 1 refills | Status: DC
  Filled 2020-12-04 – 2021-01-04 (×2): qty 90, 30d supply, fill #0
  Filled 2021-01-04: qty 90, 30d supply, fill #1

## 2020-12-26 ENCOUNTER — Other Ambulatory Visit (HOSPITAL_COMMUNITY): Payer: Self-pay

## 2020-12-26 MED ORDER — OXYCODONE-ACETAMINOPHEN 7.5-325 MG PO TABS
1.0000 | ORAL_TABLET | Freq: Four times a day (QID) | ORAL | 0 refills | Status: DC
  Filled 2020-12-26: qty 8, 2d supply, fill #0

## 2020-12-28 ENCOUNTER — Other Ambulatory Visit (HOSPITAL_COMMUNITY): Payer: Self-pay

## 2021-01-04 ENCOUNTER — Other Ambulatory Visit (HOSPITAL_COMMUNITY): Payer: Self-pay

## 2021-01-08 ENCOUNTER — Other Ambulatory Visit (HOSPITAL_COMMUNITY): Payer: Self-pay

## 2021-01-08 MED ORDER — BETAMETHASONE DIPROPIONATE 0.05 % EX CREA
TOPICAL_CREAM | Freq: Two times a day (BID) | CUTANEOUS | 6 refills | Status: DC
  Filled 2021-01-08: qty 15, 10d supply, fill #0

## 2021-01-08 MED ORDER — GABAPENTIN 100 MG PO CAPS
100.0000 mg | ORAL_CAPSULE | Freq: Every day | ORAL | 6 refills | Status: DC
  Filled 2021-01-08: qty 30, 30d supply, fill #0
  Filled 2021-03-06: qty 30, 30d supply, fill #1
  Filled 2021-04-04: qty 30, 30d supply, fill #2
  Filled 2021-06-10 – 2021-06-11 (×2): qty 30, 30d supply, fill #3
  Filled 2021-07-11: qty 30, 30d supply, fill #4
  Filled 2021-10-02: qty 30, 30d supply, fill #5

## 2021-01-08 MED ORDER — AMPHETAMINE-DEXTROAMPHETAMINE 20 MG PO TABS
20.0000 mg | ORAL_TABLET | Freq: Two times a day (BID) | ORAL | 0 refills | Status: DC
  Filled 2021-01-08: qty 60, 30d supply, fill #0

## 2021-01-25 ENCOUNTER — Other Ambulatory Visit (HOSPITAL_COMMUNITY): Payer: Self-pay

## 2021-01-25 MED ORDER — METOPROLOL SUCCINATE ER 25 MG PO TB24
25.0000 mg | ORAL_TABLET | Freq: Every day | ORAL | 2 refills | Status: DC
  Filled 2021-01-25: qty 30, 30d supply, fill #0

## 2021-02-01 ENCOUNTER — Other Ambulatory Visit (HOSPITAL_COMMUNITY): Payer: Self-pay

## 2021-02-02 ENCOUNTER — Other Ambulatory Visit (HOSPITAL_COMMUNITY): Payer: Self-pay

## 2021-02-02 MED ORDER — ALPRAZOLAM 1 MG PO TABS
0.5000 mg | ORAL_TABLET | Freq: Three times a day (TID) | ORAL | 1 refills | Status: DC | PRN
  Filled 2021-02-02: qty 90, 30d supply, fill #0
  Filled 2021-03-06: qty 90, 30d supply, fill #1

## 2021-02-04 ENCOUNTER — Other Ambulatory Visit (HOSPITAL_COMMUNITY): Payer: Self-pay

## 2021-03-06 ENCOUNTER — Other Ambulatory Visit (HOSPITAL_COMMUNITY): Payer: Self-pay

## 2021-03-07 ENCOUNTER — Other Ambulatory Visit (HOSPITAL_COMMUNITY): Payer: Self-pay

## 2021-04-04 ENCOUNTER — Other Ambulatory Visit (HOSPITAL_COMMUNITY): Payer: Self-pay

## 2021-04-04 MED ORDER — ALPRAZOLAM 1 MG PO TABS
0.5000 mg | ORAL_TABLET | Freq: Three times a day (TID) | ORAL | 1 refills | Status: DC | PRN
  Filled 2021-04-04: qty 90, 30d supply, fill #0
  Filled 2021-05-02: qty 90, 30d supply, fill #1

## 2021-05-03 ENCOUNTER — Other Ambulatory Visit (HOSPITAL_COMMUNITY): Payer: Self-pay

## 2021-06-06 ENCOUNTER — Other Ambulatory Visit (HOSPITAL_COMMUNITY): Payer: Self-pay

## 2021-06-06 MED FILL — Duloxetine HCl Enteric Coated Pellets Cap 30 MG (Base Eq): ORAL | 30 days supply | Qty: 30 | Fill #0 | Status: CN

## 2021-06-07 ENCOUNTER — Other Ambulatory Visit (HOSPITAL_COMMUNITY): Payer: Self-pay

## 2021-06-07 MED ORDER — ALPRAZOLAM 1 MG PO TABS
0.5000 mg | ORAL_TABLET | Freq: Three times a day (TID) | ORAL | 0 refills | Status: DC | PRN
  Filled 2021-06-07: qty 90, 30d supply, fill #0

## 2021-06-10 ENCOUNTER — Other Ambulatory Visit (HOSPITAL_COMMUNITY): Payer: Self-pay

## 2021-06-11 ENCOUNTER — Other Ambulatory Visit (HOSPITAL_COMMUNITY): Payer: Self-pay

## 2021-06-17 ENCOUNTER — Other Ambulatory Visit (HOSPITAL_COMMUNITY): Payer: Self-pay

## 2021-07-11 ENCOUNTER — Other Ambulatory Visit (HOSPITAL_COMMUNITY): Payer: Self-pay

## 2021-07-15 ENCOUNTER — Other Ambulatory Visit (HOSPITAL_COMMUNITY): Payer: Self-pay

## 2021-07-15 ENCOUNTER — Other Ambulatory Visit: Payer: Self-pay

## 2021-07-15 MED ORDER — ALPRAZOLAM 1 MG PO TABS
ORAL_TABLET | ORAL | 0 refills | Status: DC
  Filled 2021-07-15: qty 90, 30d supply, fill #0

## 2021-07-15 MED ORDER — ALPRAZOLAM 1 MG PO TABS
0.5000 mg | ORAL_TABLET | Freq: Three times a day (TID) | ORAL | 0 refills | Status: DC
  Filled 2021-07-15: qty 90, 30d supply, fill #0

## 2021-07-16 ENCOUNTER — Other Ambulatory Visit: Payer: Self-pay

## 2021-07-16 ENCOUNTER — Other Ambulatory Visit (HOSPITAL_COMMUNITY): Payer: Self-pay

## 2021-08-29 ENCOUNTER — Encounter (HOSPITAL_COMMUNITY): Payer: Self-pay | Admitting: Internal Medicine

## 2021-08-29 ENCOUNTER — Observation Stay (HOSPITAL_COMMUNITY)
Admission: EM | Admit: 2021-08-29 | Discharge: 2021-08-30 | Disposition: A | Discharge status: Home / Self Care | Payer: Self-pay | Attending: Internal Medicine | Admitting: Internal Medicine

## 2021-08-29 ENCOUNTER — Emergency Department (HOSPITAL_COMMUNITY): Payer: Self-pay

## 2021-08-29 DIAGNOSIS — N179 Acute kidney failure, unspecified: Secondary | ICD-10-CM | POA: Insufficient documentation

## 2021-08-29 DIAGNOSIS — R918 Other nonspecific abnormal finding of lung field: Secondary | ICD-10-CM | POA: Insufficient documentation

## 2021-08-29 DIAGNOSIS — R030 Elevated blood-pressure reading, without diagnosis of hypertension: Secondary | ICD-10-CM | POA: Insufficient documentation

## 2021-08-29 DIAGNOSIS — R770 Abnormality of albumin: Secondary | ICD-10-CM | POA: Insufficient documentation

## 2021-08-29 DIAGNOSIS — R569 Unspecified convulsions: Principal | ICD-10-CM

## 2021-08-29 DIAGNOSIS — Z20822 Contact with and (suspected) exposure to covid-19: Secondary | ICD-10-CM | POA: Insufficient documentation

## 2021-08-29 DIAGNOSIS — Z79899 Other long term (current) drug therapy: Secondary | ICD-10-CM | POA: Insufficient documentation

## 2021-08-29 DIAGNOSIS — F1010 Alcohol abuse, uncomplicated: Secondary | ICD-10-CM | POA: Insufficient documentation

## 2021-08-29 LAB — BASIC METABOLIC PANEL
Anion gap: 9 (ref 5–15)
Anion gap: 9 (ref 5–15)
BUN: 6 mg/dL (ref 6–20)
BUN: 5 mg/dL — ABNORMAL LOW (ref 6–20)
CO2: 8 mmol/L — ABNORMAL LOW (ref 22–32)
CO2: 21 mmol/L — ABNORMAL LOW (ref 22–32)
Calcium: 4.5 mg/dL — CL (ref 8.9–10.3)
Calcium: 8.6 mg/dL — ABNORMAL LOW (ref 8.9–10.3)
Chloride: 127 mmol/L — ABNORMAL HIGH (ref 98–111)
Chloride: 108 mmol/L (ref 98–111)
Creatinine, Ser: 0.66 mg/dL (ref 0.44–1.00)
Creatinine, Ser: 1.13 mg/dL — ABNORMAL HIGH (ref 0.44–1.00)
GFR, Estimated: >60 mL/min (ref >60)
GFR, Estimated: 57 mL/min — ABNORMAL LOW (ref >60)
Glucose, Bld: 87 mg/dL (ref 70–99)
Glucose, Bld: 123 mg/dL — ABNORMAL HIGH (ref 70–99)
Potassium: 2.1 mmol/L — CL (ref 3.5–5.1)
Potassium: 3.7 mmol/L (ref 3.5–5.1)
Sodium: 144 mmol/L (ref 135–145)
Sodium: 138 mmol/L (ref 135–145)

## 2021-08-29 LAB — URINALYSIS, ROUTINE W REFLEX MICROSCOPIC
Bilirubin Urine: NEGATIVE
Glucose, UA: NEGATIVE mg/dL
Ketones, ur: NEGATIVE mg/dL
Leukocytes,Ua: NEGATIVE
Nitrite: NEGATIVE
Protein, ur: NEGATIVE mg/dL
Specific Gravity, Urine: 1.01 (ref 1.005–1.030)
pH: 7 (ref 5.0–8.0)

## 2021-08-29 LAB — ETHANOL: Alcohol, Ethyl (B): <10 mg/dL (ref <10)

## 2021-08-29 LAB — AMMONIA: Ammonia: 58 umol/L — ABNORMAL HIGH (ref 9–35)

## 2021-08-29 LAB — CBC WITH DIFFERENTIAL/PLATELET
Abs Immature Granulocytes: 0.06 10*3/uL (ref 0.00–0.07)
Basophils Absolute: 0 10*3/uL (ref 0.0–0.1)
Basophils Relative: 1 %
Eosinophils Absolute: 0.1 10*3/uL (ref 0.0–0.5)
Eosinophils Relative: 1 %
HCT: 27.8 % — ABNORMAL LOW (ref 36.0–46.0)
Hemoglobin: 8.5 g/dL — ABNORMAL LOW (ref 12.0–15.0)
Immature Granulocytes: 1 %
Lymphocytes Relative: 23 %
Lymphs Abs: 1.3 10*3/uL (ref 0.7–4.0)
MCH: 30.8 pg (ref 26.0–34.0)
MCHC: 30.6 g/dL (ref 30.0–36.0)
MCV: 100.7 fL — ABNORMAL HIGH (ref 80.0–100.0)
Monocytes Absolute: 0.3 10*3/uL (ref 0.1–1.0)
Monocytes Relative: 5 %
Neutro Abs: 3.9 10*3/uL (ref 1.7–7.7)
Neutrophils Relative %: 69 %
Platelets: 154 10*3/uL (ref 150–400)
RBC: 2.76 MIL/uL — ABNORMAL LOW (ref 3.87–5.11)
RDW: 13.7 % (ref 11.5–15.5)
WBC: 5.7 10*3/uL (ref 4.0–10.5)
nRBC: 0 % (ref 0.0–0.2)

## 2021-08-29 LAB — RAPID URINE DRUG SCREEN, HOSP PERFORMED
Amphetamines: NOT DETECTED
Barbiturates: NOT DETECTED
Benzodiazepines: POSITIVE — AB
Cocaine: NOT DETECTED
Opiates: NOT DETECTED
Tetrahydrocannabinol: NOT DETECTED

## 2021-08-29 LAB — CBC
HCT: 41.8 % (ref 36.0–46.0)
Hemoglobin: 14.2 g/dL (ref 12.0–15.0)
MCH: 31.3 pg (ref 26.0–34.0)
MCHC: 34 g/dL (ref 30.0–36.0)
MCV: 92.1 fL (ref 80.0–100.0)
Platelets: 239 10*3/uL (ref 150–400)
RBC: 4.54 MIL/uL (ref 3.87–5.11)
RDW: 13.8 % (ref 11.5–15.5)
WBC: 13 10*3/uL — ABNORMAL HIGH (ref 4.0–10.5)
nRBC: 0 % (ref 0.0–0.2)

## 2021-08-29 LAB — HEPATIC FUNCTION PANEL
ALT: 12 U/L (ref 0–44)
AST: 17 U/L (ref 15–41)
Albumin: 2 g/dL — ABNORMAL LOW (ref 3.5–5.0)
Alkaline Phosphatase: 51 U/L (ref 38–126)
Bilirubin, Direct: 0.1 mg/dL (ref 0.0–0.2)
Indirect Bilirubin: 0.2 mg/dL — ABNORMAL LOW (ref 0.3–0.9)
Total Bilirubin: 0.3 mg/dL (ref 0.3–1.2)
Total Protein: 3.3 g/dL — ABNORMAL LOW (ref 6.5–8.1)

## 2021-08-29 LAB — RESP PANEL BY RT-PCR (FLU A&B, COVID) ARPGX2
Influenza A by PCR: NEGATIVE
Influenza B by PCR: NEGATIVE
SARS Coronavirus 2 by RT PCR: NEGATIVE

## 2021-08-29 LAB — MAGNESIUM: Magnesium: 1 mg/dL — ABNORMAL LOW (ref 1.7–2.4)

## 2021-08-29 MED ORDER — DULOXETINE HCL 60 MG PO CPEP
60.0000 mg | ORAL_CAPSULE | Freq: Every day | ORAL | Status: DC
  Administered 2021-08-30: 60 mg via ORAL
  Filled 2021-08-29: qty 1

## 2021-08-29 MED ORDER — ADULT MULTIVITAMIN W/MINERALS CH
1.0000 | ORAL_TABLET | Freq: Every day | ORAL | Status: DC
  Administered 2021-08-30: 1 via ORAL
  Filled 2021-08-29: qty 1

## 2021-08-29 MED ORDER — POTASSIUM CHLORIDE 10 MEQ/100ML IV SOLN: 10.0000 meq | INTRAVENOUS | Status: DC

## 2021-08-29 MED ORDER — LORAZEPAM 2 MG/ML IJ SOLN: 1.0000 mg | INTRAMUSCULAR | Status: DC | PRN

## 2021-08-29 MED ORDER — VALACYCLOVIR HCL 500 MG PO TABS
500.0000 mg | ORAL_TABLET | Freq: Every day | ORAL | Status: DC
  Administered 2021-08-30: 500 mg via ORAL
  Filled 2021-08-29: qty 1

## 2021-08-29 MED ORDER — THIAMINE HCL 100 MG/ML IJ SOLN: 100.0000 mg | Freq: Every day | INTRAMUSCULAR | Status: DC

## 2021-08-29 MED ORDER — MAGNESIUM SULFATE 2 GM/50ML IV SOLN
2.0000 g | Freq: Once | INTRAVENOUS | Status: AC
  Administered 2021-08-29: 2 g via INTRAVENOUS
  Filled 2021-08-29: qty 50

## 2021-08-29 MED ORDER — ENOXAPARIN SODIUM 40 MG/0.4ML IJ SOSY
40.0000 mg | PREFILLED_SYRINGE | Freq: Every day | INTRAMUSCULAR | Status: DC
  Administered 2021-08-30: 40 mg via SUBCUTANEOUS
  Filled 2021-08-29: qty 0.4

## 2021-08-29 MED ORDER — CALCIUM GLUCONATE-NACL 1-0.675 GM/50ML-% IV SOLN: 1.0000 g | Freq: Once | INTRAVENOUS | Status: DC

## 2021-08-29 MED ORDER — LORAZEPAM 1 MG PO TABS: 0.0000 mg | ORAL_TABLET | Freq: Four times a day (QID) | ORAL | Status: DC

## 2021-08-29 MED ORDER — ADULT MULTIVITAMIN W/MINERALS CH: 1.0000 | ORAL_TABLET | Freq: Every day | ORAL | Status: DC

## 2021-08-29 MED ORDER — SODIUM CHLORIDE 0.9 % IV SOLN
3000.0000 mg | Freq: Once | INTRAVENOUS | Status: AC
  Administered 2021-08-29: 3000 mg via INTRAVENOUS
  Filled 2021-08-29: qty 30

## 2021-08-29 MED ORDER — THIAMINE HCL 100 MG PO TABS: 100.0000 mg | ORAL_TABLET | Freq: Every day | ORAL | Status: DC

## 2021-08-29 MED ORDER — LORAZEPAM 1 MG PO TABS: 0.0000 mg | ORAL_TABLET | Freq: Two times a day (BID) | ORAL | Status: DC

## 2021-08-29 MED ORDER — FOLIC ACID 1 MG PO TABS: 1.0000 mg | ORAL_TABLET | Freq: Every day | ORAL | Status: DC

## 2021-08-29 MED ORDER — THIAMINE HCL 100 MG PO TABS
100.0000 mg | ORAL_TABLET | Freq: Every day | ORAL | Status: DC
  Administered 2021-08-30: 100 mg via ORAL
  Filled 2021-08-29: qty 1

## 2021-08-29 MED ORDER — SODIUM CHLORIDE 0.9 % IV BOLUS
1000.0000 mL | Freq: Once | INTRAVENOUS | Status: AC
  Administered 2021-08-29: 1000 mL via INTRAVENOUS

## 2021-08-29 MED ORDER — FOLIC ACID 1 MG PO TABS
1.0000 mg | ORAL_TABLET | Freq: Every day | ORAL | Status: DC
  Administered 2021-08-30: 1 mg via ORAL
  Filled 2021-08-29: qty 1

## 2021-08-29 MED ORDER — LORAZEPAM 1 MG PO TABS: 1.0000 mg | ORAL_TABLET | ORAL | Status: DC | PRN

## 2021-08-29 MED ORDER — LORAZEPAM 2 MG/ML IJ SOLN
1.0000 mg | INTRAMUSCULAR | Status: DC | PRN
  Administered 2021-08-29: 1 mg via INTRAVENOUS
  Filled 2021-08-29: qty 1

## 2021-08-29 NOTE — H&P (Addendum)
?History and Physical  ? ? ?Bonnie Russell WUJ:[id number] DOB: 1965-06-18 DOA: 08/29/2021 ? ?PCP: Barbie Banner, MD  ?Patient coming from: Home. ? ?Chief Complaint: Seizure. ? ?History obtained from patient, patient's husband and ER physician. ? ?HPI: Bonnie Russell is a 56 y.o. female with history of anxiety and depression was noticed to have a generalized tonic-clonic seizure by patient's husband.  Patient was cooking when patient felt unwell and called her husband.  Few minutes later patient started having generalized tonic-clonic seizure.  Lasted for around 2 minutes.  Patient had to be eased onto the floor.  EMS was called and patient was brought to the ER.  On the way to the ER patient had another episode and was given Versed by the EMS. ? ?Patient states she usually takes Xanax but over the last 1 week she had missed a dose because she did not have refills and had no insurance to get it refilled.  Patient states she does drink beer every day. ? ?ED Course: In the ER patient initially was postictal.  Confused.  CT head and CT chest were done which did not show any acute except for pulm nodules with CT chest.  EKG shows sinus tachycardia.  Initial labs were and repeated ones were done.  Shows mild leukocytosis and mildly elevated creatinine.  On-call neurologist Dr. Jerrell Belfast was consulted since patient had back-to-back seizures with Keppra was given and admitted for further observation. ? ?Review of Systems: As per HPI, rest all negative. ? ? ?Past Medical History:  ?Diagnosis Date  ? Anxiety   ? Contact lens/glasses fitting   ? wears contacts or glasses  ? Depression   ? ? ?Past Surgical History:  ?Procedure Laterality Date  ? BREAST BIOPSY Left 03/03/2013  ? Procedure:  NEEDLE LOCALIZATION REMOVE LEFT BREAST LUMP;  Surgeon: Currie Paris, MD;  Location: [facility];  Service: General;  Laterality: Left;  ? ECTOPIC PREGNANCY SURGERY  1992, 1998, 2001  ? INNER EAR SURGERY  as  child  ? x 2  ? ? ? reports that she has never smoked. She has never used smokeless tobacco. She reports current alcohol use. She reports that she does not use drugs. ? ?Allergies  ?Allergen Reactions  ? Morphine And Related Itching  ? Sertraline Other (See Comments)  ?  Diarrhea  ? ? ?Family History  ?Problem Relation Age of Onset  ? Throat cancer Paternal Grandfather   ? ? ?Prior to Admission medications   ?Medication Sig Start Date End Date Taking? Authorizing Provider  ?ALPRAZolam (XANAX) 1 MG tablet Take 1 mg by mouth at bedtime as needed for sleep.    [provider]  ?ALPRAZolam Prudy Feeler) 1 MG tablet Take 0.5-1 tablets (0.5-1 mg total) by mouth 3 (three) times daily as needed for anxiety 06/07/21     ?ALPRAZolam (XANAX) 1 MG tablet Take 0.5-1 tablets (0.5-1 mg total) by mouth 3 (three) times daily as needed for anxiety 07/15/21     ?amoxicillin-clavulanate (AUGMENTIN) 875-125 MG tablet Take 1 tablet by mouth every 12 hours for 7 days 11/07/20   Terald Sleeper, MD  ?amphetamine-dextroamphetamine (ADDERALL) 20 MG tablet TAKE 1 TABLET BY MOUTH TWO TIMES DAILY AS NEEDED FOR ADD 04/04/20 10/01/20  Barbie Banner, MD  ?amphetamine-dextroamphetamine (ADDERALL) 20 MG tablet Take 1 tablet (20 mg total) by mouth 2 times daily as needed for ADD 01/08/21     ?betamethasone dipropionate 0.05 % cream Apply a thin layer to the affected area  topically 2 (two) times daily. 01/08/21     ?buPROPion (WELLBUTRIN SR) 150 MG 12 hr tablet Take 150 mg by mouth 2 (two) times daily. 08/03/17   [provider]  ?cephALEXin (KEFLEX) 500 MG capsule Take 1 capsule (500 mg total) by mouth 4 (four) times daily. 11/04/17   Vanetta Mulders, MD  ?citalopram (CELEXA) 40 MG tablet Take 40 mg by mouth daily.     [provider]  ?Diclofenac Sodium CR 100 MG 24 hr tablet Take 1 tablet (100 mg total) by mouth daily. 01/24/20   Palumbo, April, MD  ?DULoxetine (CYMBALTA) 30 MG capsule TAKE 1 CAPSULE (30 MG TOTAL) BY MOUTH DAILY.  08/20/20 08/20/21  Lazoff, Shawn P, DO  ?DULoxetine (CYMBALTA) 30 MG capsule TAKE 1 CAPSULE (30 MG TOTAL) BY MOUTH DAILY. 07/19/20 07/19/21  Lazoff, Shawn P, DO  ?DULoxetine (CYMBALTA) 30 MG capsule TAKE 1 CAPSULE (30 MG TOTAL) BY MOUTH DAILY. 04/16/20 04/16/21  Lazoff, Shawn P, DO  ?DULoxetine (CYMBALTA) 60 MG capsule Take 1 capsule (60 mg total) by mouth daily. 10/10/20     ?gabapentin (NEURONTIN) 100 MG capsule Take 1 capsule (100 mg total) by mouth at bedtime. 01/08/21     ?gabapentin (NEURONTIN) 300 MG capsule TAKE 1 CAPSULE BY MOUTH 3 TIMES DAILY 04/10/20 04/10/21  Meyran, Tiana Loft, NP  ?ibuprofen (ADVIL) 600 MG tablet Take 1 tablet (600 mg total) by mouth every 6 (six) hours as needed for up to 30 doses for mild pain or moderate pain. 11/07/20   Terald Sleeper, MD  ?ibuprofen (ADVIL) 600 MG tablet Take 1 tablet by mouth every 6 hours as needed for up to 30 doses for mild pain or moderate pain 11/07/20   Terald Sleeper, MD  ?meloxicam (MOBIC) 15 MG tablet Take 15 mg by mouth daily. 08/03/17   [provider]  ?meloxicam (MOBIC) 15 MG tablet TAKE 1 TABLET BY MOUTH DAILY AS NEEDED WITH FOOD FOR ARTHRITIS PAIN 11/05/20     ?metoprolol succinate (TOPROL-XL) 25 MG 24 hr tablet Take 1 tablet (25 mg total) by mouth at bedtime. 01/25/21     ?oxyCODONE-acetaminophen (PERCOCET) 7.5-325 MG tablet Take 1 tablet by mouth every 6 (six) hours for 2 days 12/26/20     ?Phenyleph-Doxylamine-DM-APAP (NYQUIL SEVERE COLD/FLU PO) Take 1 Dose by mouth every 4 (four) hours.    [provider]  ?promethazine (PHENERGAN) 25 MG tablet Take 1 tablet (25 mg total) by mouth every 6 (six) hours as needed for nausea or vomiting. 11/04/17   Vanetta Mulders, MD  ?QUEtiapine (SEROQUEL XR) 200 MG 24 hr tablet TAKE 1 TABLET BY MOUTH DAY AT BEDTIME. 09/29/19 09/28/20  Barbie Banner, MD  ?valACYclovir (VALTREX) 500 MG tablet Take 500 mg by mouth daily.     [provider]  ?valACYclovir (VALTREX) 500 MG tablet Take 1 tablet  by mouth daily as a preventative 10/04/20     ? ? ?Physical Exam: ?Constitutional: Moderately built and nourished. ?Vitals:  ? 08/29/21 2215 08/29/21 2230 08/29/21 2240 08/29/21 2245  ?BP: (!) 152/88 (!) 153/87 (!) 153/87 (!) 149/86  ?Pulse: 96 91 94 92  ?Resp: 15 18  15   ?Temp:      ?TempSrc:      ?SpO2: 98% 97%  99%  ? ?Eyes: Anicteric no pallor. ?ENMT: No discharge from the ears eyes nose and mouth. ?Neck: No mass felt.  No neck rigidity. ?Respiratory: No rhonchi or crepitations. ?Cardiovascular: S1-S2 heard. ?Abdomen: Soft nontender bowel sound present. ?Musculoskeletal: No  edema. ?Skin: No rash. ?Neurologic: Alert awake oriented to time place and person.  Moves all extremities. ?Psychiatric: Appears normal.  Normal affect. ? ? ?Labs on Admission: I have personally reviewed following labs and imaging studies ? ?CBC: ?Recent Labs  ?Lab 08/29/21 ?2001 08/29/21 ?2204  ?WBC 5.7 13.0*  ?NEUTROABS 3.9  --   ?HGB 8.5* 14.2  ?HCT 27.8* 41.8  ?MCV 100.7* 92.1  ?PLT 154 239  ? ?Basic Metabolic Panel: ?Recent Labs  ?Lab 08/29/21 ?2001 08/29/21 ?2204  ?NA 144 138  ?K 2.1* 3.7  ?CL 127* 108  ?CO2 8* 21*  ?GLUCOSE 87 123*  ?BUN 6 5*  ?CREATININE 0.66 1.13*  ?CALCIUM 4.5* 8.6*  ?MG 1.0*  --   ? ?GFR: ?CrCl cannot be calculated (Unknown ideal weight.). ?Liver Function Tests: ?Recent Labs  ?Lab 08/29/21 ?2001  ?AST 17  ?ALT 12  ?ALKPHOS 51  ?BILITOT 0.3  ?PROT 3.3*  ?ALBUMIN 2.0*  ? ?No results for input(s): LIPASE, AMYLASE in the last 168 hours. ?Recent Labs  ?Lab 08/29/21 ?2001  ?AMMONIA 58*  ? ?Coagulation Profile: ?No results for input(s): INR, PROTIME in the last 168 hours. ?Cardiac Enzymes: ?No results for input(s): CKTOTAL, CKMB, CKMBINDEX, TROPONINI in the last 168 hours. ?BNP (last 3 results) ?No results for input(s): PROBNP in the last 8760 hours. ?HbA1C: ?No results for input(s): HGBA1C in the last 72 hours. ?CBG: ?No results for input(s): GLUCAP in the last 168 hours. ?Lipid Profile: ?No results for input(s): CHOL,  HDL, LDLCALC, TRIG, CHOLHDL, LDLDIRECT in the last 72 hours. ?Thyroid Function Tests: ?No results for input(s): TSH, T4TOTAL, FREET4, T3FREE, THYROIDAB in the last 72 hours. ?Anemia Panel: ?No results for inpu

## 2021-08-29 NOTE — Consult Note (Signed)
Neurology Consultation ? ?Reason for Consult: Seizures, status epilepticus ?Referring Physician: Dr. Langston Masker ? ?CC: Seizures ? ?History is obtained from: EMS ? ?HPI: Bonnie Russell is a 56 y.o. female past medical history of anxiety, depression, alcohol abuse last drink yesterday, noted to be confused all day today by family when EMS was called and had a 6-minute witnessed tonic-clonic seizure.  She was initially alert for EMS but then after the seizure became unresponsive.  Got 5 mg of Versed.  EMS needed to assist with ventilations.  GCS 8.  Was getting ready to be intubated but started to come around, hence intubation was held. ?She is more awake, attempting to follow some simple commands but still appears confused. ?Stat neurological consultation was obtained for the new onset seizures/status epilepticus. ?Patient unable to provide any history ?Family not at bedside at this time. ? ?Later - EDP also provided hisotry that she is on chronic Xanax and ran out of it few days ago. ? ?LKW: Unclear-possibly yesterday ?tpa given?: no, seizure likely alcohol withdrawal-less likely stroke ?Premorbid modified Rankin scale (mRS): 0 ? ?ROS:  Unable to obtain due to altered mental status.  ? ?Past Medical History:  ?Diagnosis Date  ? Anxiety   ? Contact lens/glasses fitting   ? wears contacts or glasses  ? Depression   ? ?Family History  ?Problem Relation Age of Onset  ? Throat cancer Paternal Grandfather   ? ? ? ?Social History:  ? reports that she has never smoked. She has never used smokeless tobacco. She reports current alcohol use. She reports that she does not use drugs. ? ?Medications ? ?Current Facility-Administered Medications:  ?  levETIRAcetam (KEPPRA) 3,000 mg in sodium chloride 0.9 % 250 mL IVPB, 3,000 mg, Intravenous, Once, Amie Portland, MD ? ?Current Outpatient Medications:  ?  ALPRAZolam (XANAX) 1 MG tablet, Take 1 mg by mouth at bedtime as needed for sleep., Disp: , Rfl:  ?  ALPRAZolam (XANAX) 1 MG  tablet, Take 0.5-1 tablets (0.5-1 mg total) by mouth 3 (three) times daily as needed for anxiety, Disp: 90 tablet, Rfl: 0 ?  ALPRAZolam (XANAX) 1 MG tablet, Take 0.5-1 tablets (0.5-1 mg total) by mouth 3 (three) times daily as needed for anxiety, Disp: 90 tablet, Rfl: 0 ?  amoxicillin-clavulanate (AUGMENTIN) 875-125 MG tablet, Take 1 tablet by mouth every 12 hours for 7 days, Disp: 14 tablet, Rfl: 0 ?  amphetamine-dextroamphetamine (ADDERALL) 20 MG tablet, TAKE 1 TABLET BY MOUTH TWO TIMES DAILY AS NEEDED FOR ADD, Disp: 60 tablet, Rfl: 0 ?  amphetamine-dextroamphetamine (ADDERALL) 20 MG tablet, Take 1 tablet (20 mg total) by mouth 2 times daily as needed for ADD, Disp: 60 tablet, Rfl: 0 ?  betamethasone dipropionate 0.05 % cream, Apply a thin layer to the affected area topically 2 (two) times daily., Disp: 15 g, Rfl: 6 ?  buPROPion (WELLBUTRIN SR) 150 MG 12 hr tablet, Take 150 mg by mouth 2 (two) times daily., Disp: , Rfl:  ?  cephALEXin (KEFLEX) 500 MG capsule, Take 1 capsule (500 mg total) by mouth 4 (four) times daily., Disp: 28 capsule, Rfl: 0 ?  citalopram (CELEXA) 40 MG tablet, Take 40 mg by mouth daily. , Disp: , Rfl:  ?  Diclofenac Sodium CR 100 MG 24 hr tablet, Take 1 tablet (100 mg total) by mouth daily., Disp: 10 tablet, Rfl: 0 ?  DULoxetine (CYMBALTA) 30 MG capsule, TAKE 1 CAPSULE (30 MG TOTAL) BY MOUTH DAILY., Disp: 30 capsule, Rfl: 2 ?  DULoxetine (CYMBALTA)  30 MG capsule, TAKE 1 CAPSULE (30 MG TOTAL) BY MOUTH DAILY., Disp: 30 capsule, Rfl: 0 ?  DULoxetine (CYMBALTA) 30 MG capsule, TAKE 1 CAPSULE (30 MG TOTAL) BY MOUTH DAILY., Disp: 30 capsule, Rfl: 2 ?  DULoxetine (CYMBALTA) 60 MG capsule, Take 1 capsule (60 mg total) by mouth daily., Disp: 90 capsule, Rfl: 2 ?  gabapentin (NEURONTIN) 100 MG capsule, Take 1 capsule (100 mg total) by mouth at bedtime., Disp: 30 capsule, Rfl: 6 ?  gabapentin (NEURONTIN) 300 MG capsule, TAKE 1 CAPSULE BY MOUTH 3 TIMES DAILY, Disp: 90 capsule, Rfl: 0 ?  ibuprofen (ADVIL)  600 MG tablet, Take 1 tablet (600 mg total) by mouth every 6 (six) hours as needed for up to 30 doses for mild pain or moderate pain., Disp: 30 tablet, Rfl: 0 ?  ibuprofen (ADVIL) 600 MG tablet, Take 1 tablet by mouth every 6 hours as needed for up to 30 doses for mild pain or moderate pain, Disp: 30 tablet, Rfl: 0 ?  meloxicam (MOBIC) 15 MG tablet, Take 15 mg by mouth daily., Disp: , Rfl:  ?  meloxicam (MOBIC) 15 MG tablet, TAKE 1 TABLET BY MOUTH DAILY AS NEEDED WITH FOOD FOR ARTHRITIS PAIN, Disp: 90 tablet, Rfl: 3 ?  metoprolol succinate (TOPROL-XL) 25 MG 24 hr tablet, Take 1 tablet (25 mg total) by mouth at bedtime., Disp: 30 tablet, Rfl: 2 ?  oxyCODONE-acetaminophen (PERCOCET) 7.5-325 MG tablet, Take 1 tablet by mouth every 6 (six) hours for 2 days, Disp: 8 tablet, Rfl: 0 ?  Phenyleph-Doxylamine-DM-APAP (NYQUIL SEVERE COLD/FLU PO), Take 1 Dose by mouth every 4 (four) hours., Disp: , Rfl:  ?  promethazine (PHENERGAN) 25 MG tablet, Take 1 tablet (25 mg total) by mouth every 6 (six) hours as needed for nausea or vomiting., Disp: 12 tablet, Rfl: 1 ?  QUEtiapine (SEROQUEL XR) 200 MG 24 hr tablet, TAKE 1 TABLET BY MOUTH DAY AT BEDTIME., Disp: 90 tablet, Rfl: 3 ?  valACYclovir (VALTREX) 500 MG tablet, Take 500 mg by mouth daily. , Disp: , Rfl:  ?  valACYclovir (VALTREX) 500 MG tablet, Take 1 tablet by mouth daily as a preventative, Disp: 90 tablet, Rfl: 3 ? ? ?Exam: ?Current vital signs: ?BP (!) 143/94   Pulse (!) 126   Temp 98.3 ?F (36.8 ?C) (Oral)   Resp 16   SpO2 100%  ?Vital signs in last 24 hours: ?Temp:  [98.3 ?F (36.8 ?C)] 98.3 ?F (36.8 ?C) (04/06 1958) ?Pulse Rate:  [123-134] 126 (04/06 2000) ?Resp:  [14-23] 16 (04/06 2000) ?BP: (143-144)/(88-94) 143/94 (04/06 2000) ?SpO2:  [96 %-100 %] 100 % (04/06 2000) ?General: Awake alert nonverbal ?HEENT: Normocephalic, bleeding on the nose, no neck stiffness ?CVS: Regular rhythm ?Respiratory: Breathing well and saturating normally on room air, protecting her airway  at this time ?Abdomen nondistended nontender. Appears to have had urinary incontinence as evidenced by wet pants. ?Extremities warm well perfused ?Neurological exam ?She is awake, alert, nonverbal ?Tracks examiner to both sides but does not really follow commands. ?Upon passively lifting her arms above the bed, she is able to keep them antigravity bilaterally. ?She is able to withdraw both lower extremities to noxious stimulation ?Cranial nerves: Pupils are equal round reactive light, extraocular movements appear unhindered, blinks to threat from both sides, face appears symmetric. ?Motor examination: Antigravity strength without drift in both upper extremities.  Equal withdrawal in the lower extremities bilaterally ?Sensory exam: As above ?Coordination difficult to assess given her mentation ?NIHSS ?1a Level of  Conscious.: 1 ?1b LOC Questions: 2 ?1c LOC Commands: 2 ?2 Best Gaze: 0 ?3 Visual: 0 ?4 Facial Palsy: 0 ?5a Motor Arm - left: 0 ?5b Motor Arm - Right: 0 ?6a Motor Leg - Left: 1 ?6b Motor Leg - Right: 1 ?7 Limb Ataxia: 0 ?8 Sensory: 0 ?9 Best Language: 3 ?10 Dysarthria: 2 ?11 Extinct. and Inatten.: 0 ?TOTAL: 12 ? ? ?Labs ?I have reviewed labs in epic and the results pertinent to this consultation are: ? ?CBC ?   ?Component Value Date/Time  ? WBC 10.1 11/06/2020 1516  ? RBC 4.90 11/06/2020 1516  ? HGB 14.4 11/06/2020 1516  ? HCT 43.7 11/06/2020 1516  ? PLT 296 11/06/2020 1516  ? MCV 89.2 11/06/2020 1516  ? MCH 29.4 11/06/2020 1516  ? MCHC 33.0 11/06/2020 1516  ? RDW 13.2 11/06/2020 1516  ? LYMPHSABS 2.5 11/06/2020 1516  ? MONOABS 0.6 11/06/2020 1516  ? EOSABS 0.2 11/06/2020 1516  ? BASOSABS 0.1 11/06/2020 1516  ? ? ?CMP  ?   ?Component Value Date/Time  ? NA 136 11/06/2020 1516  ? K 4.1 11/06/2020 1516  ? CL 102 11/06/2020 1516  ? CO2 26 11/06/2020 1516  ? GLUCOSE 114 (H) 11/06/2020 1516  ? BUN 9 11/06/2020 1516  ? CREATININE 0.80 11/06/2020 1516  ? CALCIUM 8.8 (L) 11/06/2020 1516  ? PROT 7.8 11/06/2020 1516  ?  ALBUMIN 4.5 11/06/2020 1516  ? AST 19 11/06/2020 1516  ? ALT 13 11/06/2020 1516  ? ALKPHOS 113 11/06/2020 1516  ? BILITOT 0.5 11/06/2020 1516  ? GFRNONAA >60 11/06/2020 1516  ? GFRAA >60 11/04/2017 1407  ? ?

## 2021-08-29 NOTE — ED Triage Notes (Addendum)
Pt here via [facility] from home for  seizure activity. Per husband pt had suddebn osnet confusion, he assisted her to the ground and she had 1-2 min grand-mal seizure. Upon EMS arrival pt was alert but confused. In ambulance pt had 34mn long grand-mal seizure, EMS gave total '5mg'$  versed, pt was unresponsive and EMS was assisting ventilations. Upon arrival GCS 8. Per husband pt is an alcoholic and had 3 beers yesterday and decided to stop drinking today. ? ?18g L AC, 20g L hand ?212/100 ?130HR ?CBG 122 ?100% BVM ?

## 2021-08-29 NOTE — Progress Notes (Signed)
EEG complete - results pending 

## 2021-08-29 NOTE — ED Notes (Signed)
Pt awakened, able to say her name and follow commands.  ?

## 2021-08-29 NOTE — ED Notes (Signed)
Neurologist Aurora MD at bedside ?

## 2021-08-29 NOTE — ED Provider Notes (Signed)
?[facility] ?Provider Note ? ? ?CSN: [id number] ?Arrival date & time: 08/29/21  1948 ? ?  ? ?History ? ?Chief Complaint  ?Patient presents with  ? Seizures  ? unresponsive  ? ? ?Bonnie Russell is a 56 y.o. female presented emergency department with concern for seizures.  Paramedics report they were called out to the scene by the patient's husband reported the patient" wandering around confused and mumbling all day".  Patient then had a generalized seizure at home.  Paramedics brought the patient was talking to them and they arrived but appeared confused, and subsequently had a second generalized tonic-clonic seizure lasting about 6 minutes in route to the hospital.  She received a total of 5 mg of IV Versed, as well as 1 L of IV fluids.  She arrives being bagged for breathing assistance, not able to provide further history.  Paramedics report the patient does have a history of chronic alcohol use, her husband reported her last drink of been yesterday.  The patient is also prescribed Xanax per my review of PDMP, is on 1 mg 3 times daily, last filled this prescription in February ? ?Her husband Minus Liberty reports the patient had been normal this morning, they started cooking dinner at 6:30pm, but the patient began acting strange, walking in circles, repeating "I need you" over and over, and soaking wet in sweat, and running into counters, she said "Something's wrong."  He called 911 and the patient seized (he caught her before she hit her head), she convulsed for 2 minutes, shaking all over, eyes closed.   ? ?She ran out of xanax a few days ago.  Also started drinking etoh a few months ago, drinks 3-4 beers daily, no other drinks, and had no signs of withdrawal per her husband's report. ? ? ?HPI ? ?  ? ?Home Medications ?Prior to Admission medications   ?Medication Sig Start Date End Date Taking? Authorizing Provider  ?ALPRAZolam (XANAX) 1 MG tablet Take 1 mg by mouth  at bedtime as needed for sleep.    [provider]  ?ALPRAZolam Duanne Moron) 1 MG tablet Take 0.5-1 tablets (0.5-1 mg total) by mouth 3 (three) times daily as needed for anxiety 06/07/21     ?ALPRAZolam (XANAX) 1 MG tablet Take 0.5-1 tablets (0.5-1 mg total) by mouth 3 (three) times daily as needed for anxiety 07/15/21     ?amoxicillin-clavulanate (AUGMENTIN) 875-125 MG tablet Take 1 tablet by mouth every 12 hours for 7 days 11/07/20   Wyvonnia Dusky, MD  ?amphetamine-dextroamphetamine (ADDERALL) 20 MG tablet TAKE 1 TABLET BY MOUTH TWO TIMES DAILY AS NEEDED FOR ADD 04/04/20 10/01/20  Christain Sacramento, MD  ?amphetamine-dextroamphetamine (ADDERALL) 20 MG tablet Take 1 tablet (20 mg total) by mouth 2 times daily as needed for ADD 01/08/21     ?betamethasone dipropionate 0.05 % cream Apply a thin layer to the affected area topically 2 (two) times daily. 01/08/21     ?buPROPion (WELLBUTRIN SR) 150 MG 12 hr tablet Take 150 mg by mouth 2 (two) times daily. 08/03/17   [provider]  ?cephALEXin (KEFLEX) 500 MG capsule Take 1 capsule (500 mg total) by mouth 4 (four) times daily. 11/04/17   Fredia Sorrow, MD  ?citalopram (CELEXA) 40 MG tablet Take 40 mg by mouth daily.     [provider]  ?Diclofenac Sodium CR 100 MG 24 hr tablet Take 1 tablet (100 mg total) by mouth daily. 01/24/20   Palumbo, April, MD  ?  DULoxetine (CYMBALTA) 30 MG capsule TAKE 1 CAPSULE (30 MG TOTAL) BY MOUTH DAILY. 08/20/20 08/20/21  Lazoff, Shawn P, DO  ?DULoxetine (CYMBALTA) 30 MG capsule TAKE 1 CAPSULE (30 MG TOTAL) BY MOUTH DAILY. 07/19/20 07/19/21  Lazoff, Shawn P, DO  ?DULoxetine (CYMBALTA) 30 MG capsule TAKE 1 CAPSULE (30 MG TOTAL) BY MOUTH DAILY. 04/16/20 04/16/21  Lazoff, Shawn P, DO  ?DULoxetine (CYMBALTA) 60 MG capsule Take 1 capsule (60 mg total) by mouth daily. 10/10/20     ?gabapentin (NEURONTIN) 100 MG capsule Take 1 capsule (100 mg total) by mouth at bedtime. 01/08/21     ?gabapentin (NEURONTIN) 300 MG capsule TAKE 1 CAPSULE BY  MOUTH 3 TIMES DAILY 04/10/20 04/10/21  Meyran, Ocie Cornfield, NP  ?ibuprofen (ADVIL) 600 MG tablet Take 1 tablet (600 mg total) by mouth every 6 (six) hours as needed for up to 30 doses for mild pain or moderate pain. 11/07/20   Wyvonnia Dusky, MD  ?ibuprofen (ADVIL) 600 MG tablet Take 1 tablet by mouth every 6 hours as needed for up to 30 doses for mild pain or moderate pain 11/07/20   Wyvonnia Dusky, MD  ?meloxicam (MOBIC) 15 MG tablet Take 15 mg by mouth daily. 08/03/17   [provider]  ?meloxicam (MOBIC) 15 MG tablet TAKE 1 TABLET BY MOUTH DAILY AS NEEDED WITH FOOD FOR ARTHRITIS PAIN 11/05/20     ?metoprolol succinate (TOPROL-XL) 25 MG 24 hr tablet Take 1 tablet (25 mg total) by mouth at bedtime. 01/25/21     ?oxyCODONE-acetaminophen (PERCOCET) 7.5-325 MG tablet Take 1 tablet by mouth every 6 (six) hours for 2 days 12/26/20     ?Phenyleph-Doxylamine-DM-APAP (NYQUIL SEVERE COLD/FLU PO) Take 1 Dose by mouth every 4 (four) hours.    [provider]  ?promethazine (PHENERGAN) 25 MG tablet Take 1 tablet (25 mg total) by mouth every 6 (six) hours as needed for nausea or vomiting. 11/04/17   Fredia Sorrow, MD  ?QUEtiapine (SEROQUEL XR) 200 MG 24 hr tablet TAKE 1 TABLET BY MOUTH DAY AT BEDTIME. 09/29/19 09/28/20  Christain Sacramento, MD  ?valACYclovir (VALTREX) 500 MG tablet Take 500 mg by mouth daily.     [provider]  ?valACYclovir (VALTREX) 500 MG tablet Take 1 tablet by mouth daily as a preventative 10/04/20     ?   ? ?Allergies    ?Morphine and related and Sertraline   ? ?Review of Systems   ?Review of Systems ? ?Physical Exam ?Updated Vital Signs ?BP (!) 149/86   Pulse 92   Temp 98.3 ?F (36.8 ?C) (Oral)   Resp 15   SpO2 99%  ?Physical Exam ? ?ED Results / Procedures / Treatments   ?Labs ?(all labs ordered are listed, but only abnormal results are displayed) ?Labs Reviewed  ?BASIC METABOLIC PANEL - Abnormal; Notable for the following components:  ?    Result Value  ? Potassium 2.1 (*)    ? Chloride 127 (*)   ? CO2 8 (*)   ? Calcium 4.5 (*)   ? All other components within normal limits  ?CBC WITH DIFFERENTIAL/PLATELET - Abnormal; Notable for the following components:  ? RBC 2.76 (*)   ? Hemoglobin 8.5 (*)   ? HCT 27.8 (*)   ? MCV 100.7 (*)   ? All other components within normal limits  ?AMMONIA - Abnormal; Notable for the following components:  ? Ammonia 58 (*)   ? All other components within normal limits  ?URINALYSIS, ROUTINE W REFLEX MICROSCOPIC -  Abnormal; Notable for the following components:  ? Color, Urine STRAW (*)   ? Hgb urine dipstick SMALL (*)   ? Bacteria, UA RARE (*)   ? All other components within normal limits  ?RAPID URINE DRUG SCREEN, HOSP PERFORMED - Abnormal; Notable for the following components:  ? Benzodiazepines POSITIVE (*)   ? All other components within normal limits  ?MAGNESIUM - Abnormal; Notable for the following components:  ? Magnesium 1.0 (*)   ? All other components within normal limits  ?HEPATIC FUNCTION PANEL - Abnormal; Notable for the following components:  ? Total Protein 3.3 (*)   ? Albumin 2.0 (*)   ? Indirect Bilirubin 0.2 (*)   ? All other components within normal limits  ?BASIC METABOLIC PANEL - Abnormal; Notable for the following components:  ? CO2 21 (*)   ? Glucose, Bld 123 (*)   ? BUN 5 (*)   ? Creatinine, Ser 1.13 (*)   ? Calcium 8.6 (*)   ? GFR, Estimated 57 (*)   ? All other components within normal limits  ?CBC - Abnormal; Notable for the following components:  ? WBC 13.0 (*)   ? All other components within normal limits  ?RESP PANEL BY RT-PCR (FLU A&B, COVID) [id number]  ?ETHANOL  ? ? ?EKG ?EKG Interpretation ? ?Date/Time:  Thursday August 29 2021 19:49:59 EDT ?Ventricular Rate:  128 ?PR Interval:  147 ?QRS Duration: 85 ?QT Interval:  283 ?QTC Calculation: 413 ?R Axis:   83 ?Text Interpretation: Sinus tachycardia Repol abnrm suggests ischemia, diffuse leads Confirmed by Octaviano Glow [phone]) on 08/29/2021 9:40:50 PM ? ?Radiology ?CT HEAD WO CONTRAST  (5MM) ? ?Result Date: 08/29/2021 ?CLINICAL DATA:  Seizure, new-onset, no history of trauma EXAM: CT HEAD WITHOUT CONTRAST TECHNIQUE: Contiguous axial images were obtained from the base of the skull through th

## 2021-08-29 NOTE — Procedures (Signed)
Patient Name: Bonnie Russell  ?MRN: [id number]  ?Epilepsy Attending: Lora Havens  ?Referring Physician/Provider: Amie Portland, MD ?Date: 08/29/2021 ?Duration: 26.23 mins ? ?Patient history:  56 year old history of alcohol abuse anxiety and depression with sudden onset of alcohol cessation yesterday coming in with a 6-minute tonic-clonic seizure preceded by some confusion and being off of baseline. Also ran out of her Xanax. EEG to evaluate for seizure ? ?Level of alertness: Awake ? ?AEDs during EEG study: LEV ? ?Technical aspects: This EEG study was done with scalp electrodes positioned according to the 10-20 International system of electrode placement. Electrical activity was acquired at a sampling rate of '500Hz'$  and reviewed with a high frequency filter of '70Hz'$  and a low frequency filter of '1Hz'$ . EEG data were recorded continuously and digitally stored.  ? ?Description: The posterior dominant rhythm consists of 8-9 Hz activity of moderate voltage (25-35 uV) seen predominantly in posterior head regions, symmetric and reactive to eye opening and eye closing. Physiologic photic driving was not seen during photic stimulation.  Hyperventilation was not performed.   ? ?IMPRESSION: ?This study is within normal limits. No seizures or epileptiform discharges were seen throughout the recording. ? ?Lora Havens  ? ?

## 2021-08-30 ENCOUNTER — Other Ambulatory Visit: Payer: Self-pay

## 2021-08-30 ENCOUNTER — Other Ambulatory Visit (HOSPITAL_COMMUNITY): Payer: Self-pay

## 2021-08-30 ENCOUNTER — Observation Stay (HOSPITAL_COMMUNITY): Payer: Self-pay

## 2021-08-30 LAB — CBC
HCT: 40.2 % (ref 36.0–46.0)
HCT: 39.5 % (ref 36.0–46.0)
Hemoglobin: 13.7 g/dL (ref 12.0–15.0)
Hemoglobin: 13.1 g/dL (ref 12.0–15.0)
MCH: 31 pg (ref 26.0–34.0)
MCH: 30.5 pg (ref 26.0–34.0)
MCHC: 34.1 g/dL (ref 30.0–36.0)
MCHC: 33.2 g/dL (ref 30.0–36.0)
MCV: 91 fL (ref 80.0–100.0)
MCV: 91.9 fL (ref 80.0–100.0)
Platelets: 248 10*3/uL (ref 150–400)
Platelets: 254 10*3/uL (ref 150–400)
RBC: 4.42 MIL/uL (ref 3.87–5.11)
RBC: 4.3 MIL/uL (ref 3.87–5.11)
RDW: 13.6 % (ref 11.5–15.5)
RDW: 13.8 % (ref 11.5–15.5)
WBC: 10 10*3/uL (ref 4.0–10.5)
WBC: 9.9 10*3/uL (ref 4.0–10.5)
nRBC: 0 % (ref 0.0–0.2)
nRBC: 0 % (ref 0.0–0.2)

## 2021-08-30 LAB — BASIC METABOLIC PANEL
Anion gap: 6 (ref 5–15)
BUN: 6 mg/dL (ref 6–20)
CO2: 24 mmol/L (ref 22–32)
Calcium: 8.5 mg/dL — ABNORMAL LOW (ref 8.9–10.3)
Chloride: 110 mmol/L (ref 98–111)
Creatinine, Ser: 1.03 mg/dL — ABNORMAL HIGH (ref 0.44–1.00)
GFR, Estimated: >60 mL/min (ref >60)
Glucose, Bld: 109 mg/dL — ABNORMAL HIGH (ref 70–99)
Potassium: 3.5 mmol/L (ref 3.5–5.1)
Sodium: 140 mmol/L (ref 135–145)

## 2021-08-30 LAB — CREATININE, SERUM
Creatinine, Ser: 0.99 mg/dL (ref 0.44–1.00)
GFR, Estimated: >60 mL/min (ref >60)

## 2021-08-30 LAB — HIV ANTIBODY (ROUTINE TESTING W REFLEX): HIV Screen 4th Generation wRfx: NONREACTIVE

## 2021-08-30 MED ORDER — DIAZEPAM 2 MG PO TABS
ORAL_TABLET | ORAL | 0 refills | Status: AC
Start: 2021-08-30 — End: 2021-09-21
  Filled 2021-08-30: qty 61, 22d supply, fill #0

## 2021-08-30 MED ORDER — DIAZEPAM 5 MG PO TABS
10.0000 mg | ORAL_TABLET | Freq: Two times a day (BID) | ORAL | Status: DC
Start: 2021-08-30 — End: 2021-08-30
  Administered 2021-08-30: 10 mg via ORAL
  Filled 2021-08-30: qty 2

## 2021-08-30 MED ORDER — LEVETIRACETAM 500 MG PO TABS
500.0000 mg | ORAL_TABLET | Freq: Two times a day (BID) | ORAL | 2 refills | Status: DC
  Filled 2021-08-30: qty 60, 30d supply, fill #0
  Filled 2021-09-27: qty 60, 30d supply, fill #1
  Filled 2021-10-28: qty 60, 30d supply, fill #2

## 2021-08-30 MED ORDER — MAGIC MOUTHWASH W/LIDOCAINE: 5.0000 mL | Freq: Three times a day (TID) | ORAL | Status: DC | PRN

## 2021-08-30 MED ORDER — LEVETIRACETAM 500 MG PO TABS
500.0000 mg | ORAL_TABLET | Freq: Two times a day (BID) | ORAL | Status: DC
  Administered 2021-08-30: 500 mg via ORAL
  Filled 2021-08-30: qty 1

## 2021-08-30 MED ORDER — ACETAMINOPHEN 325 MG PO TABS
650.0000 mg | ORAL_TABLET | Freq: Four times a day (QID) | ORAL | Status: DC | PRN
  Administered 2021-08-30: 650 mg via ORAL
  Filled 2021-08-30: qty 2

## 2021-08-30 MED ORDER — LABETALOL HCL 5 MG/ML IV SOLN: 5.0000 mg | INTRAVENOUS | Status: DC | PRN

## 2021-08-30 MED ORDER — ADULT MULTIVITAMIN W/MINERALS CH
1.0000 | ORAL_TABLET | Freq: Every day | ORAL | 0 refills | Status: AC
  Filled 2021-08-30: qty 30, 30d supply, fill #0

## 2021-08-30 MED ORDER — ALPRAZOLAM 0.25 MG PO TABS: 0.5000 mg | ORAL_TABLET | Freq: Three times a day (TID) | ORAL | Status: DC | PRN

## 2021-08-30 MED ORDER — THIAMINE HCL 100 MG PO TABS
100.0000 mg | ORAL_TABLET | Freq: Every day | ORAL | 0 refills | Status: DC
  Filled 2021-08-30 – 2021-09-27 (×2): qty 30, 30d supply, fill #0

## 2021-08-30 MED ORDER — MAGIC MOUTHWASH W/LIDOCAINE
5.0000 mL | Freq: Three times a day (TID) | ORAL | 0 refills | Status: DC | PRN
  Filled 2021-08-30: qty 60, 4d supply, fill #0

## 2021-08-30 MED ORDER — LEVETIRACETAM IN NACL 500 MG/100ML IV SOLN
500.0000 mg | Freq: Two times a day (BID) | INTRAVENOUS | Status: DC
  Filled 2021-08-30: qty 100

## 2021-08-30 MED ORDER — FOLIC ACID 1 MG PO TABS
1.0000 mg | ORAL_TABLET | Freq: Every day | ORAL | 0 refills | Status: DC
  Filled 2021-08-30: qty 30, 30d supply, fill #0

## 2021-08-30 NOTE — Discharge Summary (Signed)
Physician Discharge Summary  ?Bonnie Russell QIO:[id number] DOB: 04/12/1966 DOA: 08/29/2021 ? ?PCP: Barbie Banner, MD ? ?Admit date: 08/29/2021 ?Discharge date: 08/30/2021 ? ?Admitted From: Home ?Disposition: Home ? ?Recommendations for Outpatient Follow-up:  ?Follow up with PCP in 1-2 weeks  ?Follow up with Neurology within 1-2 weeks  ?Follow-up on Lung nodules found on CT scan ?Please obtain CMP/CBC, Mag Phos in one week ?Please follow up on the following pending results: ? ?Home Health: No  ?Equipment/Devices:None   ? ?Discharge Condition: Stable  ?CODE STATUS: FULL CODE  ?Diet recommendation: Regular Diet ? ?Brief/Interim Summary: ?HPI per Dr. Midge Minium ?HPI: Bonnie Russell is a 56 y.o. female with history of anxiety and depression was noticed to have a generalized tonic-clonic seizure by patient's husband.  Patient was cooking when patient felt unwell and called her husband.  Few minutes later patient started having generalized tonic-clonic seizure.  Lasted for around 2 minutes.  Patient had to be eased onto the floor.  EMS was called and patient was brought to the ER.  On the way to the ER patient had another episode and was given Versed by the EMS. ?  ?Patient states she usually takes Xanax but over the last 1 week she had missed a dose because she did not have refills and had no insurance to get it refilled.  Patient states she does drink beer every day. ?  ?ED Course: In the ER patient initially was postictal.  Confused.  CT head and CT chest were done which did not show any acute except for pulm nodules with CT chest.  EKG shows sinus tachycardia.  Initial labs were and repeated ones were done.  Shows mild leukocytosis and mildly elevated creatinine.  On-call neurologist Dr. Jerrell Belfast was consulted since patient had back-to-back seizures with Keppra was given and admitted for further observation. ?  ?**Interim History ?Patient was evaluated by the neurology team and she had an EEG done  which was unremarkable.  Neurology recommended place the patient on Keppra 500 twice daily and placing the patient on a Valium taper with outpatient follow-up.  Valium taper as delineated in Dr. Candi Leash note.  Patient improved and was deemed medically stable to be discharged and she was recommended not to drive for 6 months as delineated. ? ?Discharge Diagnoses:  ?Principal Problem: ?  Seizure (HCC) ? ?Seizure likely precipitated by patient running out of Xanax and benzodiazepine withdrawal.  Discussed with Dr. Jerrell Belfast on-call neurologist.  Advised keeping patient on CIWA protocol with Ativan.  Closely monitor.  MRI done and is unremarkable ?-Neurology evaluated recommending that this was a provoked seizure however they recommended Keppra 500 mg twice daily for 3 months and then recommended Valium taper as delineated.  MRI done without contrast and was negative.  Patient was given seizure precautions and cleared and she will need to follow-up with her PCP as well as neurology in outpatient setting ? ?Elevated blood pressure reading likely from withdrawal.   ?-We will keep patient on as needed IV labetalol follow blood pressure trends.  Resume home medications  ? ?Alcohol abuse ?Patient states she does drink alcohol every day.  We will keep patient on CIWA protocol.  Social work consult.  Thiamine.  Has been given a Valium taper but delineated by neurology ? ?History of depression  ?-denies any suicidal ideation we will continue home dose of Cymbalta. ? ?Lung nodules ?-CT Scan: Multiple pulmonary nodules. Most severe: 3 mm left solid ?pulmonary nodule within the upper lobe. If patient  is low risk for ?malignancy, no routine follow-up imaging is recommended; if patient ?is high risk for malignancy, a non-contrast Chest CT at 12 months is ?optional. If performed and the nodule is stable at 12 months, no ?further follow-up is recommended. ? ?AKI ?-creatinine elevated when compared to the 1 done in June 2022.  Likely from  dehydration.  Hydrate and recheck. ?-BUN/creatinine i slightly trended up but started trending back down will need outpatient follow-up within 1 week ?  ? ?Discharge Instructions ? ?Discharge Instructions   ? ? Call MD for:  difficulty breathing, headache or visual disturbances   Complete by: As directed ?  ? Call MD for:  extreme fatigue   Complete by: As directed ?  ? Call MD for:  hives   Complete by: As directed ?  ? Call MD for:  persistant dizziness or light-headedness   Complete by: As directed ?  ? Call MD for:  persistant nausea and vomiting   Complete by: As directed ?  ? Call MD for:  redness, tenderness, or signs of infection (pain, swelling, redness, odor or green/yellow discharge around incision site)   Complete by: As directed ?  ? Call MD for:  severe uncontrolled pain   Complete by: As directed ?  ? Call MD for:  temperature >100.4   Complete by: As directed ?  ? Diet - low sodium heart healthy   Complete by: As directed ?  ? Discharge instructions   Complete by: As directed ?  ? You were cared for by a hospitalist during your hospital stay. If you have any questions about your discharge medications or the care you received while you were in the hospital after you are discharged, you can call the unit and ask to speak with the hospitalist on call if the hospitalist that took care of you is not available. Once you are discharged, your primary care physician will handle any further medical issues. Please note that NO REFILLS for any discharge medications will be authorized once you are discharged, as it is imperative that you return to your primary care physician (or establish a relationship with a primary care physician if you do not have one) for your aftercare needs so that they can reassess your need for medications and monitor your lab values. ? ?Follow up with PCP and Neurology. Take all medications as prescribed. If symptoms change or worsen please return to the ED for evaluation  ? ?SEIZURE  PRECAUTIONS ?Per [facility] statutes, patients with seizures are not allowed to drive until they have been seizure-free for six months.  ? ?Use caution when using heavy equipment or power tools. Avoid working on ladders or at heights. Take showers instead of baths. Ensure the water temperature is not too high on the home water heater. Do not go swimming alone. Do not lock yourself in a room alone (i.e. bathroom). When caring for infants or small children, sit down when holding, feeding, or changing them to minimize risk of injury to the child in the event you have a seizure. Maintain good sleep hygiene. Avoid alcohol.  ?  ?If patient has another seizure, call 911 and bring them back to the ED if: ?A.  The seizure lasts longer than 5 minutes.      ?B.  The patient doesn't wake shortly after the seizure or has new problems such as difficulty seeing, speaking or moving following the seizure ?C.  The patient was injured during the seizure ?D.  The patient has a temperature over 102 F (39C) ?E.  The patient vomited during the seizure and now is having trouble breathing  ? Driving Restrictions   Complete by: As directed ?  ? SEIZURE PRECAUTIONS ?Per [facility] statutes, patients with seizures are not allowed to drive until they have been seizure-free for six months.  ? ?Use caution when using heavy equipment or power tools. Avoid working on ladders or at heights. Take showers instead of baths. Ensure the water temperature is not too high on the home water heater. Do not go swimming alone. Do not lock yourself in a room alone (i.e. bathroom). When caring for infants or small children, sit down when holding, feeding, or changing them to minimize risk of injury to the child in the event you have a seizure. Maintain good sleep hygiene. Avoid alcohol.  ?  ?If patient has another seizure, call 911 and bring them back to the ED if: ?A.  The seizure lasts longer than 5 minutes.      ?B.  The patient doesn't wake  shortly after the seizure or has new problems such as difficulty seeing, speaking or moving following the seizure ?C.  The patient was injured during the seizure ?D.  The patient has a temperature over 102

## 2021-08-30 NOTE — ED Notes (Signed)
Patient transported to MRI 

## 2021-08-30 NOTE — ED Notes (Signed)
Pt verbalizes understanding of discharge instructions. Opportunity for questions and answers were provided. Pt discharged from the ED.   ?

## 2021-08-30 NOTE — Progress Notes (Signed)
Transition of Care [facility]) - Emergency Department Mini Assessment ? ? ?Patient Details  ?Name: Bonnie Russell ?MRN: [id number] ?Date of Birth: [birth year] ? ?Transition of Care (TOC) CM/SW Contact:    ?Rodney Booze, LCSW ?Phone Number: ?08/30/2021, 3:51 PM ? ? ?Clinical Narrative: ? ? ? ?ED Mini Assessment: ?What brought you to the Emergency Department? : (P) [place] activity ? ?Barriers to Discharge: (P) No Barriers Identified ? ?Barrier interventions: (P) CSW met with patient to discuss SUD issues that lead up to seizure due to withdraw issues. CSW discussed intervention for SUD use patient is already familiar with SUD interventions. CSW also made sure the patient knew about Local ip[facility] resources also noted in patient AVS. Cage Aid Score was completed. ? ?Means of departure: (P) Not know ? ?Interventions which prevented an admission or readmission: (P) SUD counseling ? ? ? ?Patient Contact and Communications ?  ?  ?  ? ,     ?  ?  ? ?  ?  ?  ? ?Admission diagnosis:  Seizure ([place]) [R56.9] ?Patient Active Problem List  ? Diagnosis Date Noted  ? Seizure ([place]) 08/29/2021  ? 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 ?PCP:  Christain Sacramento, MD ?Pharmacy:   ?[facility] ?[address] ?[place] [address] ?Phone: [phone] Fax: [phone] ?  ?

## 2021-08-30 NOTE — Progress Notes (Addendum)
Subjective: No further events overnight.. Denies ever having seizures in the past.  Does report drinking 3-4 tall beers every few days in the evening.  States she was taking Xanax about 2 to 3 mg/day but ran out about a week ago.  Reports increased stress since end of last year.  Husband at bedside. ? ?ROS: negative except above ? ?Examination ? ?Vital signs in last 24 hours: ?Temp:  [98.3 ?F (36.8 ?C)] 98.3 ?F (36.8 ?C) (04/06 1958) ?Pulse Rate:  [81-134] 84 (04/07 0730) ?Resp:  [14-23] 15 (04/07 0730) ?BP: (125-168)/(73-101) 168/100 (04/07 0730) ?SpO2:  [91 %-100 %] 97 % (04/07 0730) ? ?General: lying in bed, NAD ?Neuro: AOx3, cranial nerves II through grossly intact, 5/5 in all 4 extremities, FTN intact bilaterally, right tongue bite ? ?Basic Metabolic Panel: ?Recent Labs  ?Lab 08/29/21 ?2001 08/29/21 ?2204 08/30/21 ?0138 08/30/21 ?0449  ?NA 144 138 140  --   ?K 2.1* 3.7 3.5  --   ?CL 127* 108 110  --   ?CO2 8* 21* 24  --   ?GLUCOSE 87 123* 109*  --   ?BUN 6 5* 6  --   ?CREATININE 0.66 1.13* 1.03* 0.99  ?CALCIUM 4.5* 8.6* 8.5*  --   ?MG 1.0*  --   --   --   ? ? ?CBC: ?Recent Labs  ?Lab 08/29/21 ?2001 08/29/21 ?2204 08/30/21 ?0138 08/30/21 ?0449  ?WBC 5.7 13.0* 10.0 9.9  ?NEUTROABS 3.9  --   --   --   ?HGB 8.5* 14.2 13.7 13.1  ?HCT 27.8* 41.8 40.2 39.5  ?MCV 100.7* 92.1 91.0 91.9  ?PLT 154 239 248 254  ? ? ? ?Coagulation Studies: ?No results for input(s): LABPROT, INR in the last 72 hours. ? ?Imaging ?CT head without contrast/10/2021: No acute abnormality.  Mild chronic small vessel disease unchanged. ? ? ?ASSESSMENT AND PLAN: 56 year old female who presented with generalized tonic-clonic seizure-like activity lasting more than 5 minutes in the setting of Xanax withdrawal, alcohol use, increased stress. ? ?Provoked seizure ?AKI ?Hyperammonemia ?Hypoproteinemia with hypoalbuminemia ?Tongue bite ?-Due to alcohol use, Xanax withdrawal ?-Routine EEG did not show any ictal-interictal  abnormality ? ?Recommendations ?-This was likely a provoked seizure.  However due to the long duration of seizure, recommend Keppra 500 mg twice daily for about 3 months.  Would recommend neurology follow-up in 3 months and at that point, if patient remains seizure-free decision can be made to wean off of Keppra ?-Recommend Valium taper as patient is on Xanax for anxiety and depression and would like to taper. ?-Recommend Valium 10 mg twice daily for 3 days followed by 5 mg twice daily for 3 days followed by 5 mg daily for 3 days followed by 2 mg daily for 3 days followed by 1 mg every day.  Follow-up with primary care physician.  Patient can then discuss and stop Valium completely ?-MRI brain without contrast ordered and pending.  If within normal limits, patient can be discharged from neurology standpoint ?-Discussed seizure precautions including do not drive ?-Management of rest of comorbidities per primary team ?-Discussed plan with Dr. Alfredia Ferguson ? ?Seizure precautions: ?Per [facility] statutes, patients with seizures are not allowed to drive until they have been seizure-free for six months and cleared by a physician  ?  ?Use caution when using heavy equipment or power tools. Avoid working on ladders or at heights. Take showers instead of baths. Ensure the water temperature is not too high on the home water heater. Do not  go swimming alone. Do not lock yourself in a room alone (i.e. bathroom). When caring for infants or small children, sit down when holding, feeding, or changing them to minimize risk of injury to the child in the event you have a seizure. Maintain good sleep hygiene. Avoid alcohol.  ?  ?If patient has another seizure, call 911 and bring them back to the ED if: ?A.  The seizure lasts longer than 5 minutes.      ?B.  The patient doesn't wake shortly after the seizure or has new problems such as difficulty seeing, speaking or moving following the seizure ?C.  The patient was injured during the  seizure ?D.  The patient has a temperature over 102 F (39C) ?E.  The patient vomited during the seizure and now is having trouble breathing ?   ?During the Seizure ?  ?- First, ensure adequate ventilation and place patients on the floor on their left side  ?Loosen clothing around the neck and ensure the airway is patent. If the patient is clenching the teeth, do not force the mouth open with any object as this can cause severe damage ?- Remove all items from the surrounding that can be hazardous. The patient may be oblivious to what's happening and may not even know what he or she is doing. ?If the patient is confused and wandering, either gently guide him/her away and block access to outside areas ?- Reassure the individual and be comforting ?- Call 911. In most cases, the seizure ends before EMS arrives. However, there are cases when seizures may last over 3 to 5 minutes. Or the individual may have developed breathing difficulties or severe injuries. If a pregnant patient or a person with diabetes develops a seizure, it is prudent to call an ambulance. ?- Finally, if the patient does not regain full consciousness, then call EMS. Most patients will remain confused for about 45 to 90 minutes after a seizure, so you must use judgment in calling for help. ? ? After the Seizure (Postictal Stage) ?  ?After a seizure, most patients experience confusion, fatigue, muscle pain and/or a headache. Thus, one should permit the individual to sleep. For the next few days, reassurance is essential. Being calm and helping reorient the person is also of importance. ?  ?Most seizures are painless and end spontaneously. Seizures are not harmful to others but can lead to complications such as stress on the lungs, brain and the heart. Individuals with prior lung problems may develop labored breathing and respiratory distress.  ? ?I have spent a total of  40  minutes with the patient reviewing hospital notes,  test results, labs and  examining the patient as well as establishing an assessment and plan that was discussed personally with the patient, husband at bedside50% of time was spent in direct patient care. ?  ? ?Zeb Comfort ?Epilepsy ?[facility] ?For questions after 5pm please refer to AMION to reach the Neurologist on call ? ?

## 2021-09-02 ENCOUNTER — Other Ambulatory Visit (HOSPITAL_COMMUNITY): Payer: Self-pay

## 2021-09-27 ENCOUNTER — Other Ambulatory Visit (HOSPITAL_COMMUNITY): Payer: Self-pay

## 2021-10-02 ENCOUNTER — Other Ambulatory Visit (HOSPITAL_COMMUNITY): Payer: Self-pay

## 2021-10-28 ENCOUNTER — Other Ambulatory Visit (HOSPITAL_COMMUNITY): Payer: Self-pay

## 2021-11-29 ENCOUNTER — Other Ambulatory Visit (HOSPITAL_COMMUNITY): Payer: Self-pay

## 2021-12-02 ENCOUNTER — Other Ambulatory Visit (HOSPITAL_COMMUNITY): Payer: Self-pay

## 2021-12-05 ENCOUNTER — Other Ambulatory Visit (HOSPITAL_COMMUNITY): Payer: Self-pay

## 2021-12-10 ENCOUNTER — Other Ambulatory Visit (HOSPITAL_COMMUNITY): Payer: Self-pay

## 2023-01-22 ENCOUNTER — Other Ambulatory Visit (HOSPITAL_COMMUNITY): Payer: Self-pay

## 2023-01-22 MED ORDER — ALPRAZOLAM 0.5 MG PO TABS
0.5000 mg | ORAL_TABLET | Freq: Two times a day (BID) | ORAL | 2 refills | Status: AC | PRN
  Filled 2023-01-22: qty 30, 15d supply, fill #0
  Filled 2023-02-19: qty 30, 15d supply, fill #1
  Filled 2023-03-22: qty 30, 15d supply, fill #2

## 2023-02-19 ENCOUNTER — Other Ambulatory Visit: Payer: Self-pay

## 2023-02-20 ENCOUNTER — Other Ambulatory Visit (HOSPITAL_COMMUNITY): Payer: Self-pay

## 2023-03-10 ENCOUNTER — Encounter: Payer: Self-pay | Admitting: Hematology & Oncology

## 2023-03-10 ENCOUNTER — Inpatient Hospital Stay: Payer: BC Managed Care – PPO | Admitting: Hematology & Oncology

## 2023-03-10 ENCOUNTER — Inpatient Hospital Stay: Payer: BC Managed Care – PPO | Attending: Hematology & Oncology

## 2023-03-10 ENCOUNTER — Other Ambulatory Visit: Payer: Self-pay

## 2023-03-10 ENCOUNTER — Encounter: Payer: Self-pay | Admitting: *Deleted

## 2023-03-10 VITALS — BP 158/99 | HR 80 | Temp 98.5°F | Resp 18 | Ht 63.0 in | Wt 207.0 lb

## 2023-03-10 DIAGNOSIS — R61 Generalized hyperhidrosis: Secondary | ICD-10-CM | POA: Diagnosis not present

## 2023-03-10 DIAGNOSIS — C8299 Follicular lymphoma, unspecified, extranodal and solid organ sites: Secondary | ICD-10-CM

## 2023-03-10 DIAGNOSIS — C8290 Follicular lymphoma, unspecified, unspecified site: Secondary | ICD-10-CM | POA: Diagnosis not present

## 2023-03-10 DIAGNOSIS — C8218 Follicular lymphoma grade II, lymph nodes of multiple sites: Secondary | ICD-10-CM

## 2023-03-10 HISTORY — DX: Follicular lymphoma grade ii, lymph nodes of multiple sites: C82.18

## 2023-03-10 LAB — CMP (CANCER CENTER ONLY)
ALT: 14 U/L (ref 0–44)
AST: 21 U/L (ref 15–41)
Albumin: 4.5 g/dL (ref 3.5–5.0)
Alkaline Phosphatase: 93 U/L (ref 38–126)
Anion gap: 11 (ref 5–15)
BUN: 12 mg/dL (ref 6–20)
CO2: 27 mmol/L (ref 22–32)
Calcium: 9.6 mg/dL (ref 8.9–10.3)
Chloride: 101 mmol/L (ref 98–111)
Creatinine: 1.11 mg/dL — ABNORMAL HIGH (ref 0.44–1.00)
GFR, Estimated: 58 mL/min — ABNORMAL LOW (ref >60)
Glucose, Bld: 102 mg/dL — ABNORMAL HIGH (ref 70–99)
Potassium: 3.9 mmol/L (ref 3.5–5.1)
Sodium: 139 mmol/L (ref 135–145)
Total Bilirubin: 0.5 mg/dL (ref 0.3–1.2)
Total Protein: 7.5 g/dL (ref 6.5–8.1)

## 2023-03-10 LAB — CBC WITH DIFFERENTIAL (CANCER CENTER ONLY)
Abs Immature Granulocytes: 0.03 10*3/uL (ref 0.00–0.07)
Basophils Absolute: 0.1 10*3/uL (ref 0.0–0.1)
Basophils Relative: 1 %
Eosinophils Absolute: 0.2 10*3/uL (ref 0.0–0.5)
Eosinophils Relative: 2 %
HCT: 42.8 % (ref 36.0–46.0)
Hemoglobin: 14.8 g/dL (ref 12.0–15.0)
Immature Granulocytes: 1 %
Lymphocytes Relative: 25 %
Lymphs Abs: 1.6 10*3/uL (ref 0.7–4.0)
MCH: 32.2 pg (ref 26.0–34.0)
MCHC: 34.6 g/dL (ref 30.0–36.0)
MCV: 93 fL (ref 80.0–100.0)
Monocytes Absolute: 0.5 10*3/uL (ref 0.1–1.0)
Monocytes Relative: 8 %
Neutro Abs: 4.2 10*3/uL (ref 1.7–7.7)
Neutrophils Relative %: 63 %
Platelet Count: 222 10*3/uL (ref 150–400)
RBC: 4.6 MIL/uL (ref 3.87–5.11)
RDW: 13.2 % (ref 11.5–15.5)
WBC Count: 6.5 10*3/uL (ref 4.0–10.5)
nRBC: 0 % (ref 0.0–0.2)

## 2023-03-10 LAB — LACTATE DEHYDROGENASE: LDH: 173 U/L (ref 98–192)

## 2023-03-10 NOTE — Progress Notes (Signed)
Referral MD  Reason for Referral: Follicular NHL  Chief Complaint  Patient presents with   New Patient (Initial Visit)  : I have lymphoma  HPI: [name] is already well-known to me.  She is a very charming 57 year old white female.  She is actually  the daughter one of our patients.  I have been seeing her mom for quite a while.  [name] is followed by Dr. Vivi Ferns of family medicine.  She has noted that there is was a swollen lymph node in the right inguinal area.  Otherwise, she has been doing okay.  She has had some night sweats.  She has had maybe some low-grade temperatures.  She has had no change in bowel or bladder habits.  She has had no cough or shortness of breath.  She is up-to-date with her mammograms.  She said that her most recent mammogram did not show any adenopathy in the axilla.  She subsequently underwent a CT of the abdomen and pelvis.  This was done at [facility].  This was done on 01/22/2023.  This showed that she had lymphadenopathy in the abdomen pelvis.  She had right iliac chain lymph node measuring 2.5 cm.  She had a right inguinal lymph node measuring 2.2 cm.  Her liver looks fine.  There is no splenomegaly.  She then underwent a lymph node biopsy.  This was done on 03/03/2023.  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  This is consistent with a follicular non-Hodgkin's lymphoma.  She has had no problems with bleeding.  She has had no dysphagia or odynophagia.  She has not noted any lymph nodes in the neck although behind her  right ear, there may be a small lymph node.  Of note, I think her sister has Hodgkin's lymphoma.  She does not smoke.  She really does not have much of an alcohol use.  She has been getting the COVID vaccines.  She has never had COVID.  Overall, I would have said that her performance status is ECOG 0.    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) 03/10/2023  :   Past Surgical History:  Procedure Laterality Date   BREAST BIOPSY Left 03/03/2013   Procedure:  NEEDLE LOCALIZATION REMOVE LEFT BREAST LUMP;  Surgeon: Currie Paris, MD;  Location: [facility];  Service: General;  Laterality: Left;   ECTOPIC PREGNANCY SURGERY  1992, 1998, 2001   INNER EAR SURGERY  as child   x 2  :   Current Outpatient Medications:    diphenhydrAMINE HCl, Sleep, (ZZZQUIL) 50 MG/30ML LIQD, Take 1 Capful by mouth at bedtime as needed., Disp: , Rfl:    metoprolol succinate (TOPROL-XL) 50 MG 24 hr tablet, Take 1 tablet by mouth daily., Disp: , Rfl:    ALPRAZolam (XANAX) 0.5 MG tablet, Take 1 tablet (0.5 mg total) by mouth 2 (two) times daily as needed for anxiety, Disp: 30 tablet, Rfl: 2   aspirin-acetaminophen-caffeine (EXCEDRIN MIGRAINE) 250-250-65 MG tablet, Take by mouth every 6 (six) hours as needed for headache., Disp: , Rfl:    DULoxetine (CYMBALTA) 60 MG capsule, Take 1 capsule (60 mg total) by mouth daily., Disp: 90 capsule, Rfl: 2   Multiple Vitamin (MULTIVITAMIN WITH MINERALS) TABS tablet, Take 1 tablet by mouth  daily., Disp: 30 tablet, Rfl: 0   valACYclovir (VALTREX) 500 MG tablet, Take 500 mg by mouth daily., Disp: , Rfl: :  :   Allergies  Allergen Reactions   Morphine Itching   Sertraline Other (See Comments) and Diarrhea    Diarrhea   Hydroxyzine     Did not like how it made her feel   Morphine And Codeine Itching   Peanut (Diagnostic) Other (See Comments)    Occasionally causes "eye swelling"   Shellfish Allergy Other (See Comments)    Occasionally causes "eye swelling"  :   Family History  Problem Relation Age of Onset   Throat cancer Paternal Grandfather   :   Social History   Socioeconomic History   Marital status: Legally Separated    Spouse name: Not on file   Number of children: Not on file    Years of education: Not on file   Highest education level: Not on file  Occupational History   Not on file  Tobacco Use   Smoking status: Never   Smokeless tobacco: Never  Vaping Use   Vaping status: Never Used  Substance and Sexual Activity   Alcohol use: Yes    Comment: 2-3 beers/wine weekly   Drug use: No   Sexual activity: Not on file  Other Topics Concern   Not on file  Social History Narrative   Not on file   Social Determinants of Health   Financial Resource Strain: Low Risk  (09/28/2019)   Received from [facility] visits prior to 07/26/2022.   Overall Financial Resource Strain (CARDIA)    Difficulty of Paying Living Expenses: Not very hard  Food Insecurity: Medium Risk (12/31/2022)   Received from [facility]   Hunger Vital Sign    Worried About Running Out of Food in the Last Year: Never true    Ran Out of Food in the Last Year: Sometimes true  Transportation Needs: Not on file (12/31/2022)  Physical Activity: Insufficiently Active (09/28/2019)   Received from [facility] visits prior to 07/26/2022.   Exercise Vital Sign    Days of Exercise per Week: 1 day    Minutes of Exercise per Session: 60 min  Stress: No Stress Concern Present (09/28/2019)   Received from [facility] visits prior to 07/26/2022.   [employer] of Occupational Health - Occupational Stress Questionnaire    Feeling of Stress : Only a little  Social Connections: Socially Integrated (09/28/2019)   Received from [facility] visits prior to 07/26/2022.   Social Advertising account executive [NHANES]    Frequency of Communication with Friends and Family: More than three times a week    Frequency of Social Gatherings with Friends and Family: Once a week    Attends Religious Services: 1 to 4 times per year    Active Member of [employer] or Organizations: Yes    Attends Banker Meetings: More than 4 times per year     Marital Status: Married  Catering manager Violence: Not At Risk (09/28/2019)   Received from [facility] visits prior to 07/26/2022.   Humiliation, Afraid, Rape, and Kick questionnaire    Fear of Current or Ex-Partner: No    Emotionally Abused: No    Physically Abused: No    Sexually Abused: No  : Review of Systems  Constitutional:  Positive for fever.  HENT: Negative.    Eyes: Negative.  Respiratory: Negative.    Cardiovascular: Negative.   Gastrointestinal: Negative.   Genitourinary: Negative.   Musculoskeletal: Negative.   Skin: Negative.   Neurological: Negative.   Endo/Heme/Allergies: Negative.   Psychiatric/Behavioral: Negative.       Exam: Vital signs show temperature of 98.5.  Pulse 80.  Blood pressure 158/100.  Weight is 207 pounds.  @IPVITALS @ Physical Exam Vitals reviewed.  HENT:     Head: Normocephalic and atraumatic.  Eyes:     Pupils: Pupils are equal, round, and reactive to light.  Cardiovascular:     Rate and Rhythm: Normal rate and regular rhythm.     Heart sounds: Normal heart sounds.  Pulmonary:     Effort: Pulmonary effort is normal.     Breath sounds: Normal breath sounds.  Abdominal:     General: Bowel sounds are normal.     Palpations: Abdomen is soft.  Musculoskeletal:        General: No tenderness or deformity. Normal range of motion.     Cervical back: Normal range of motion.  Lymphadenopathy:     Cervical: No cervical adenopathy.  Skin:    General: Skin is warm and dry.     Findings: No erythema or rash.  Neurological:     Mental Status: She is alert and oriented to person, place, and time.  Psychiatric:        Behavior: Behavior normal.        Thought Content: Thought content normal.        Judgment: Judgment normal.       Recent Labs    03/10/23 1320  WBC 6.5  HGB 14.8  HCT 42.8  PLT 222    Recent Labs    03/10/23 1320  NA 139  K 3.9  CL 101  CO2 27  GLUCOSE 102*  BUN 12  CREATININE 1.11*   CALCIUM 9.6    Blood smear review: Unremarkable  Pathology: See above    Assessment and Plan: [name] is a very charming 57 year old white female.  It looks like she has a follicular non-Hodgkin's lymphoma.  She has adenopathy in the abdomen and pelvis.  We really need to see if she has adenopathy in the chest.  It would not surprise me if she did.  I think that we probably are going to have to treat this.  She is symptomatic with the night sweats and low-grade temperatures.  Think that the best treatment for her would be Rituxan/Bendamustine.  I think this would be highly effective with about 90% chance of remission.  I think she will need to have a Port-A-Cath.  She really does not have a lot of good peripheral access.  I long talk with she and her mom.  I also think that a PET scan will help [place] out.  We will see back in a PET scan set up in a week or so.  Again, I think that she would benefit and responded very nicely to Rituxan/Bendamustine.  I went over the toxicity of chemotherapy.  I went over the toxicity of the Rituxan for her first cycle only.  Again I think that she will do quite well.  I think slightly good quality of life.  After the second cycle of treatment, we will plan for another follow-up scan to see how she is doing.  He was enjoyable talking to her.  I have known her for quite a while since we have been treating her mom for about 6 years.  Will plan to get started about 2 weeks or so.  We can certainly work around the holiday if necessary.

## 2023-03-10 NOTE — Progress Notes (Signed)
START ON PATHWAY REGIMEN - Lymphoma and CLL     A cycle is every 28 days:     Rituximab-xxxx      Bendamustine   **Always confirm dose/schedule in your pharmacy ordering system**  Patient Characteristics: Follicular Lymphoma, Grades 1, 2, and 3A, First Line, Stage III / IV, Symptomatic or Bulky Disease Disease Type: Follicular Lymphoma, Grade 1, 2, or 3A Disease Type: Not Applicable Disease Type: Not Applicable Line of Therapy: First Line Disease Characteristics: Symptomatic or Bulky Disease Intent of Therapy: Curative Intent, Not Discussed with Patient

## 2023-03-11 ENCOUNTER — Other Ambulatory Visit: Payer: Self-pay

## 2023-03-11 ENCOUNTER — Encounter: Payer: Self-pay | Admitting: Hematology & Oncology

## 2023-03-11 ENCOUNTER — Encounter: Payer: Self-pay | Admitting: *Deleted

## 2023-03-11 DIAGNOSIS — C8218 Follicular lymphoma grade II, lymph nodes of multiple sites: Secondary | ICD-10-CM

## 2023-03-11 LAB — KAPPA/LAMBDA LIGHT CHAINS
Kappa free light chain: 15.6 mg/L (ref 3.3–19.4)
Kappa, lambda light chain ratio: 1.29 (ref 0.26–1.65)
Lambda free light chains: 12.1 mg/L (ref 5.7–26.3)

## 2023-03-11 LAB — IGG, IGA, IGM
IgA: 62 mg/dL — ABNORMAL LOW (ref 87–352)
IgG (Immunoglobin G), Serum: 955 mg/dL (ref 586–1602)
IgM (Immunoglobulin M), Srm: 205 mg/dL (ref 26–217)

## 2023-03-11 LAB — BETA 2 MICROGLOBULIN, SERUM: Beta-2 Microglobulin: 1.9 mg/L (ref 0.6–2.4)

## 2023-03-11 NOTE — Progress Notes (Signed)
Patient plan to start chemotherapy on 04/02/2023. Prior to that patient needs port, PET and chemo education.   Port scheduled for 04/01/2023. Chemo education scheduled for 03/24/2023. Patient is aware of both appointments including date, time and location.   PET scheduled for 03/23/2023. Patient is aware of PET appointment including date, time, and location. The following prep is reviewed with patient and confirmed with teachback: - arrive 30 minutes before appointment time - NPO except water for 6h before scan. No candy, no gum - hold any diabetic medication the morning of the scan - have a low carb dinner the night prior Radiology Information sheet also mailed to patient's home for reinforcement of education.   Oncology Nurse Navigator Documentation     03/11/2023   10:30 AM  Oncology Nurse Navigator Flowsheets  Navigator Follow Up Date: 03/24/2023  Navigator Follow Up Reason: Chemo Class  Navigator Location [facility]  Navigator Encounter Type Appt/Treatment Plan Review;Telephone  Telephone Outgoing Call  Patient Visit Type MedOnc  Treatment Phase Pre-Tx/Tx Discussion  Barriers/Navigation Needs Coordination of Care;Education  Education Other  Interventions Coordination of Care;Education  Acuity Level 2-Minimal Needs (1-2 Barriers Identified)  Coordination of Care Appts  Education Method Verbal;Teach-back;Written  Support Groups/Services Friends and Family  Time Spent with Patient 30

## 2023-03-11 NOTE — Progress Notes (Signed)
Patient with new diagnosis of Follicular Lymphoma. Referral to social work and nutrition placed per protocol.   Will review note and orders once Dr Myna Hidalgo completes his encounter.   Oncology Nurse Navigator Documentation     03/10/2023    1:00 PM  Oncology Nurse Navigator Flowsheets  Abnormal Finding Date 02/16/2023  Confirmed Diagnosis Date 03/03/2023  Diagnosis Status Confirmed Diagnosis Complete  Navigator Follow Up Date: 03/11/2023  Navigator Follow Up Reason: Appointment Review  Navigator Location [facility]  Referral Date to RadOnc/MedOnc 03/06/2023  Navigator Encounter Type Initial MedOnc  Barriers/Navigation Needs Coordination of Care  Interventions Referrals  Acuity Level 2-Minimal Needs (1-2 Barriers Identified)  Referrals Nutrition/dietician;Social Work  Support Groups/Services Friends and Family  Time Spent with Patient 15

## 2023-03-12 ENCOUNTER — Telehealth: Payer: Self-pay

## 2023-03-12 ENCOUNTER — Other Ambulatory Visit (HOSPITAL_COMMUNITY): Payer: Self-pay

## 2023-03-12 MED ORDER — AMLODIPINE BESYLATE 5 MG PO TABS
5.0000 mg | ORAL_TABLET | Freq: Every day | ORAL | 1 refills | Status: AC
  Filled 2023-03-12: qty 30, 30d supply, fill #0

## 2023-03-12 NOTE — Telephone Encounter (Signed)
Clinical Social Work was referred by medical provider for assessment of psychosocial needs.  CSW attempted to contact patient by phone.  Left voicemail with contact information and request for return call.

## 2023-03-18 ENCOUNTER — Other Ambulatory Visit: Payer: Self-pay

## 2023-03-18 ENCOUNTER — Telehealth: Payer: Self-pay | Admitting: *Deleted

## 2023-03-18 ENCOUNTER — Encounter: Payer: Self-pay | Admitting: Hematology & Oncology

## 2023-03-18 NOTE — Telephone Encounter (Signed)
"  Carolan Bullins-Phillips, [phone]) returning call to Dara about my work accommodation."  [employer], CMA returns to office.  Form is in progress.  Noted the leave time period, restrictions at work and symptoms are three questions this nurse sees Lynwood Dawley needs to know.    "I am a fairly new Doctor, hospital.  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  Expect treatment for the next six months but did not know about the December appointments is why I listed for this to end on April 03, 2023.  Normal work hours are Mon. - Fri. 8:00 am to 4:30 pm with a thirty-minute lunch.   Asked to be out of work 03/23/2023 from 8:00 am to 12:00 pm for PET scan,   Appointment on 03/24/2023, asked to leave work from 2:25  pm to 4:30 pm.   Requested the entire day out on 11/62024, 04/02/2023 and 04/03/2023 for port a cath placement and treatment.   Requested appointments on Thursdays and Fridays to rest on the weekends to continue working.   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  There are about fifteen of [place].  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  A good accommodation would be a cubicle wall allowing other staff through my office. Currently having trouble walking, bending due to lymph nodes in groin.  Appetite is not good.  What do I do if I am not feeling well?  My PTO time is low. "  This nurse advised accommodation request should be up to twelve months.  Treatment plan and expected schedule will be shared for coverage when needed to be out of work.  You would like two full days out of work for day1 & day 2 the week of treatments and time for any flare up coverage.  Speak with your HR to obtain FMLA form on 05/07/2023.  Get FMLA form to provider as soon as possible.  Accommodation if approved will provide coverage until receipt of authorization of FMLA.  Expect treatment on Day  1, and Day 2 to cycle every 28-days expected through 08/21/2023.   The treatment is a long day especially on day one receiving the initial Rituxan being assessed every 30-minutes for infusion side effects.  Expect decreased immunity and other side effects with the agent [employer] is why you must not interact with others to avoid people who are sick.  This nurse will provide your information to Dara expected to return tomorrow. Currently denies further questions or needs,

## 2023-03-23 ENCOUNTER — Other Ambulatory Visit: Payer: Self-pay

## 2023-03-23 ENCOUNTER — Encounter (HOSPITAL_COMMUNITY)
Admission: RE | Admit: 2023-03-23 | Discharge: 2023-03-23 | Disposition: A | Discharge status: Home / Self Care | Payer: BC Managed Care – PPO | Source: Ambulatory Visit | Attending: Hematology & Oncology | Admitting: Hematology & Oncology

## 2023-03-23 DIAGNOSIS — C8218 Follicular lymphoma grade II, lymph nodes of multiple sites: Secondary | ICD-10-CM | POA: Insufficient documentation

## 2023-03-23 LAB — GLUCOSE, CAPILLARY: Glucose-Capillary: 119 mg/dL — ABNORMAL HIGH (ref 70–99)

## 2023-03-23 MED ORDER — FLUDEOXYGLUCOSE F - 18 (FDG) INJECTION
10.3300 | Freq: Once | INTRAVENOUS | Status: AC
  Administered 2023-03-23: 10.33 via INTRAVENOUS

## 2023-03-24 ENCOUNTER — Encounter: Payer: Self-pay | Admitting: *Deleted

## 2023-03-24 ENCOUNTER — Inpatient Hospital Stay: Payer: BC Managed Care – PPO

## 2023-03-24 ENCOUNTER — Other Ambulatory Visit: Payer: Self-pay | Admitting: *Deleted

## 2023-03-24 DIAGNOSIS — C8218 Follicular lymphoma grade II, lymph nodes of multiple sites: Secondary | ICD-10-CM

## 2023-03-24 MED ORDER — ONDANSETRON HCL 8 MG PO TABS: 8.0000 mg | ORAL_TABLET | Freq: Three times a day (TID) | ORAL | 1 refills | Status: DC | PRN

## 2023-03-24 MED ORDER — PROCHLORPERAZINE MALEATE 10 MG PO TABS: 10.0000 mg | ORAL_TABLET | Freq: Four times a day (QID) | ORAL | 1 refills | Status: DC | PRN

## 2023-03-24 MED ORDER — DEXAMETHASONE 4 MG PO TABS: 8.0000 mg | ORAL_TABLET | Freq: Every day | ORAL | 1 refills | Status: DC

## 2023-03-24 NOTE — Progress Notes (Signed)
Patient in chemotherapy education class with self.  Discussed side effects of Rituxan, Bendamustine which include but are not limited to myelosuppression, decreased appetite, fatigue, fever, allergic or infusional reaction, mucositis, cardiac toxicity, cough, SOB, altered taste, nausea and vomiting, diarrhea, constipation, elevated LFTs myalgia and arthralgias, hair loss or thinning, rash, skin dryness, nail changes, peripheral neuropathy, discolored urine, delayed wound healing, mental changes (Chemo brain), increased risk of infections, weight loss.  Reviewed infusion room and office policy and procedure and phone numbers 24 hours x 7 days a week.  Reviewed when to call the office with any concerns or problems.  Scientist, clinical (histocompatibility and immunogenetics) given.  Discussed portacath insertion and EMLA cream administration.  Antiemetic protocol and chemotherapy schedule reviewed. Patient verbalized understanding of chemotherapy indications and possible side effects.  Teachback done

## 2023-03-25 ENCOUNTER — Inpatient Hospital Stay: Payer: BC Managed Care – PPO | Admitting: Licensed Clinical Social Worker

## 2023-03-25 ENCOUNTER — Encounter: Payer: Self-pay | Admitting: *Deleted

## 2023-03-25 ENCOUNTER — Inpatient Hospital Stay: Payer: BC Managed Care – PPO | Admitting: Dietician

## 2023-03-25 NOTE — Progress Notes (Signed)
Initial RN Navigator Patient Visit  Name: Bonnie Russell Diagnosis: Follicular Lymphoma  This navigator was out of the office for new patient appointment. Visited with patient after her chemo education appointment.   Met with patient prior to their visit with MD. [name] patient "Your Patient Navigator" handout which explains my role, areas in which I am able to help, and all the contact information for myself and the office. Also gave patient MD and Navigator business card.   New patient packet given to patient which includes: orientation to office and staff; campus directory; education on My Chart and Advance Directives; and patient centered education on follicular lymphoma.   Patient understands all follow up procedures and expectations. They have my number to reach out for any further clarification or additional needs.   Oncology Nurse Navigator Documentation     03/24/2023    4:00 PM  Oncology Nurse Navigator Flowsheets  Navigator Follow Up Date: 04/02/2023  Navigator Follow Up Reason: Chemotherapy  Navigator Location [facility]  Navigator Encounter Type Education  Patient Visit Type MedOnc  Treatment Phase Pre-Tx/Tx Discussion  Barriers/Navigation Needs Coordination of Care;Education  Education Other;Newly Diagnosed Cancer Education;Preparing for Upcoming Surgery/ Treatment  Interventions Education;Psycho-Social Support  Acuity Level 2-Minimal Needs (1-2 Barriers Identified)  Education Method Verbal;Written  Support Groups/Services Friends and Family  Time Spent with Patient 30

## 2023-03-25 NOTE — Progress Notes (Signed)
[facility] Clinical Social Work  Initial Assessment   Bonnie Russell is a 57 y.o. year old female contacted by phone. Clinical Social Work was referred by nurse navigator for assessment of psychosocial needs.   SDOH (Social Determinants of Health) assessments performed: Yes SDOH Interventions    Flowsheet Row Clinical Support from 03/25/2023 in [facility]  SDOH Interventions   Housing Interventions Intervention Not Indicated  Transportation Interventions Intervention Not Indicated  Financial Strain Interventions Intervention Not Indicated  Health Literacy Interventions Intervention Not Indicated       SDOH Screenings   Food Insecurity: Medium Risk (12/31/2022)   Received from [facility]  Housing: Low Risk  (03/25/2023)  Transportation Needs: No Transportation Needs (03/25/2023)  Utilities: Low Risk  (12/31/2022)   Received from [facility]  Financial Resource Strain: Low Risk  (03/25/2023)  Physical Activity: Insufficiently Active (09/28/2019)   Received from [facility] visits prior to 07/26/2022.  Social Connections: Socially Integrated (09/28/2019)   Received from [facility] visits prior to 07/26/2022.  Stress: No Stress Concern Present (09/28/2019)   Received from [facility] visits prior to 07/26/2022.  Tobacco Use: Low Risk  (03/10/2023)  Health Literacy: Adequate Health Literacy (03/25/2023)     Distress Screen completed: No     No data to display            Family/Social Information:  Housing Arrangement: patient lives alone Family members/support persons in your life? Family Transportation concerns: no  Employment: Working full time  Income source: Educational psychologist concerns: No Type of concern: None Food access concerns: no Religious or spiritual practice: Yes-Patient identifies as Psychologist, educational Currently in place:  [employer]  Coping/ Adjustment to  diagnosis: Patient understands treatment plan and what happens next? Yes.  Her younger sister was diagnosed with the same cancer in February of this year and recently completed her treatment.  Her sister is also a Charity fundraiser. Concerns about diagnosis and/or treatment: I'm not especially worried about anything Patient reported stressors:  None per patient. Hopes and/or priorities: Mom and sisters. Patient enjoys time with family/ friends Current coping skills/ strengths: Ability for insight , Active sense of humor , Average or above average intelligence , Capable of independent living , Communication skills , Financial means , General fund of knowledge , Motivation for treatment/growth , and Supportive family/friends     SUMMARY: Current SDOH Barriers:  None per patient.  Clinical Social Work Clinical Goal(s):  No clinical social work goals at this time  Interventions: Discussed common feeling and emotions when being diagnosed with cancer, and the importance of support during treatment Informed patient of the support team roles and support services at [facility] Provided CSW contact information and encouraged patient to call with any questions or concerns Provided patient with information about the [employer] and will mail program literature.   Follow Up Plan: Patient will contact CSW with any support or resource needs Patient verbalizes understanding of plan: Yes    Dorothey Baseman, LCSW Clinical Social Worker [facility]

## 2023-03-25 NOTE — Progress Notes (Signed)
Nutrition Assessment: Reached out to patient at home telephone number.    Reason for Assessment: New Patient Assessment   ASSESSMENT: Patient is a 57 year old female with lymphoma.  She is followed by Dr.Ennever who is planning to treat with  Rituxan/Bendamustine q 28 days for 6 cycles.  She reports no actual food allergies but had eye swelling reactions with some shrimp, had a peanut reaction ( throat started closing up).  Needs new script for a epi pen since she never knows when it will happen. She reports desire to eat healthier.  She is not a huge sweet eater but loves ice cream. Doesn't eat early in morning first meal is mid morning (ow sugar instant oats). Not a 3 meal a day person, not a big snacker, wants to eat more fruits and vegetables, loves beans Fluid: Water 25oz bottle 4-6 a day, Zero sugar Gingerale    Nutrition Focused Physical Exam: unable to perform NFPE   Medications: Takes 2 gummy MVI daily   Labs: 03/10/23 GFR 58   Anthropometrics: no significant weight changes, would like to lose weight  Height: 63" Weight: 207# UBW: 205-210# BMI: 36.67   NUTRITION DIAGNOSIS: Food and Nutrition Related Knowledge Deficit related to cancer and associated treatments as evidenced by no prior need for nutrition related information.   INTERVENTION:   Relayed that nutrition services are wrap around service provided at no charge and encouraged continued communication if experiencing any nutritional impact symptoms (NIS). Educated on importance of adequate nourishment with calorie and protein energy intake with nutrient dense foods when possible to maintain weight/strength and QOL.   Discussed  good protein to foods (adding milk to oatmeal at breakfast) goal 20-30 grams protein each feed. Encouraged eating with no more than 4-5 hours between feeds. Emailed Nutrition Tip sheet  for  Nutrition During Cancer Treatment with contact information provided.  MONITORING, EVALUATION,  GOAL: weight trends, nutrition impact symptoms, PO intake, labs    Next Visit: PRN at patient or provider request.  Gennaro Africa, RDN, LDN Registered Dietitian, [facility] Part Time Remote (Usual office hours: Tuesday-Thursday) Mobile: [phone]

## 2023-03-26 NOTE — Progress Notes (Signed)
Pharmacist Chemotherapy Monitoring - Initial Assessment    Anticipated start date: 04/02/23   The following has been reviewed per standard work regarding the patient's treatment regimen: The patient's diagnosis, treatment plan and drug doses, and organ/hematologic function Lab orders and baseline tests specific to treatment regimen  The treatment plan start date, drug sequencing, and pre-medications Prior authorization status  Patient's documented medication list, including drug-drug interaction screen and prescriptions for anti-emetics and supportive care specific to the treatment regimen The drug concentrations, fluid compatibility, administration routes, and timing of the medications to be used The patient's access for treatment and lifetime cumulative dose history, if applicable  The patient's medication allergies and previous infusion related reactions, if applicable   Changes made to treatment plan:  N/A  Follow up needed:  N/A   Candelaria Stagers, [facility], 03/26/2023  8:36 AM

## 2023-03-27 ENCOUNTER — Encounter: Payer: Self-pay | Admitting: *Deleted

## 2023-03-27 ENCOUNTER — Telehealth: Payer: Self-pay

## 2023-03-27 NOTE — Progress Notes (Signed)
Patient calling upset that her FMLA hasn't been completed and sent back to her work. She is concerned as her employer states they need the form today or the case will be closed.   FMLA team able to find forms, ensure completion, and fax back to [employer] this am. Called to notify patient; message left on voicemail.   Oncology Nurse Navigator Documentation     03/27/2023    8:00 AM  Oncology Nurse Navigator Flowsheets  Navigator Follow Up Date: 04/02/2023  Navigator Follow Up Reason: Chemotherapy  Navigator Location [facility]  Navigator Encounter Type Telephone;Letter/Fax/Email  Telephone Incoming Call  Patient Visit Type MedOnc  Treatment Phase Pre-Tx/Tx Discussion  Barriers/Navigation Needs Coordination of Care;Education  Education Other  Interventions Disability/FMLA;Education  Acuity Level 2-Minimal Needs (1-2 Barriers Identified)  Education Method Verbal  Support Groups/Services Friends and Family  Time Spent with Patient 30

## 2023-03-27 NOTE — Telephone Encounter (Signed)
Left voicemail informing patient that her [employer] medical accommodations documents had been completed and faxed to the company. Fax confirmation received. Copy of documents mailed to patient.

## 2023-03-30 ENCOUNTER — Encounter: Payer: Self-pay | Admitting: *Deleted

## 2023-03-30 NOTE — Progress Notes (Signed)
Reviewed patient baseline PET. No unanticipated findings.   Oncology Nurse Navigator Documentation     03/30/2023    7:45 AM  Oncology Nurse Navigator Flowsheets  Navigator Follow Up Date: 04/02/2023  Navigator Follow Up Reason: Chemotherapy  Navigator Location [facility]  Navigator Encounter Type Scan Review  Patient Visit Type MedOnc  Treatment Phase Pre-Tx/Tx Discussion  Barriers/Navigation Needs Coordination of Care;Education  Interventions None Required  Acuity Level 2-Minimal Needs (1-2 Barriers Identified)  Support Groups/Services Friends and Family  Time Spent with Patient 15

## 2023-03-31 ENCOUNTER — Other Ambulatory Visit: Payer: Self-pay | Admitting: Student

## 2023-03-31 ENCOUNTER — Other Ambulatory Visit: Payer: Self-pay | Admitting: Radiology

## 2023-04-01 ENCOUNTER — Encounter (HOSPITAL_COMMUNITY): Payer: Self-pay | Admitting: *Deleted

## 2023-04-01 ENCOUNTER — Other Ambulatory Visit: Payer: Self-pay

## 2023-04-01 ENCOUNTER — Ambulatory Visit (HOSPITAL_COMMUNITY)
Admission: RE | Admit: 2023-04-01 | Discharge: 2023-04-01 | Disposition: A | Discharge status: Home / Self Care | Payer: BC Managed Care – PPO | Source: Ambulatory Visit | Attending: Hematology & Oncology | Admitting: Hematology & Oncology

## 2023-04-01 ENCOUNTER — Ambulatory Visit (HOSPITAL_COMMUNITY)
Admission: RE | Admit: 2023-04-01 | Discharge: 2023-04-01 | Disposition: A | Discharge status: Home / Self Care | Payer: BC Managed Care – PPO | Source: Ambulatory Visit | Attending: Hematology & Oncology

## 2023-04-01 DIAGNOSIS — F32A Depression, unspecified: Secondary | ICD-10-CM | POA: Insufficient documentation

## 2023-04-01 DIAGNOSIS — F419 Anxiety disorder, unspecified: Secondary | ICD-10-CM | POA: Insufficient documentation

## 2023-04-01 DIAGNOSIS — C8218 Follicular lymphoma grade II, lymph nodes of multiple sites: Secondary | ICD-10-CM | POA: Insufficient documentation

## 2023-04-01 DIAGNOSIS — C8299 Follicular lymphoma, unspecified, extranodal and solid organ sites: Secondary | ICD-10-CM | POA: Diagnosis present

## 2023-04-01 HISTORY — DX: Essential (primary) hypertension: I10

## 2023-04-01 HISTORY — PX: IR IMAGING GUIDED PORT INSERTION: IMG5740

## 2023-04-01 MED ORDER — LIDOCAINE-EPINEPHRINE 1 %-1:100000 IJ SOLN
INTRAMUSCULAR | Status: AC
Start: 2023-04-01 — End: ?
  Filled 2023-04-01: qty 1

## 2023-04-01 MED ORDER — SODIUM CHLORIDE 0.9 % IV SOLN: INTRAVENOUS | Status: DC

## 2023-04-01 MED ORDER — FENTANYL CITRATE (PF) 100 MCG/2ML IJ SOLN
INTRAMUSCULAR | Status: AC | PRN
  Administered 2023-04-01 (×2): 25 ug via INTRAVENOUS
  Administered 2023-04-01: 50 ug via INTRAVENOUS

## 2023-04-01 MED ORDER — MIDAZOLAM HCL 2 MG/2ML IJ SOLN
INTRAMUSCULAR | Status: AC
  Filled 2023-04-01: qty 2

## 2023-04-01 MED ORDER — LIDOCAINE-EPINEPHRINE 1 %-1:100000 IJ SOLN
20.0000 mL | Freq: Once | INTRAMUSCULAR | Status: AC
  Administered 2023-04-01: 14 mL via INTRADERMAL

## 2023-04-01 MED ORDER — HEPARIN SOD (PORK) LOCK FLUSH 100 UNIT/ML IV SOLN
500.0000 [IU] | Freq: Once | INTRAVENOUS | Status: AC
  Administered 2023-04-01: 500 [IU] via INTRAVENOUS

## 2023-04-01 MED ORDER — HEPARIN SOD (PORK) LOCK FLUSH 100 UNIT/ML IV SOLN
INTRAVENOUS | Status: AC
  Filled 2023-04-01: qty 5

## 2023-04-01 MED ORDER — MIDAZOLAM HCL 2 MG/2ML IJ SOLN
INTRAMUSCULAR | Status: AC | PRN
  Administered 2023-04-01 (×2): .5 mg via INTRAVENOUS
  Administered 2023-04-01: 1 mg via INTRAVENOUS

## 2023-04-01 MED ORDER — FENTANYL CITRATE (PF) 100 MCG/2ML IJ SOLN
INTRAMUSCULAR | Status: AC
  Filled 2023-04-01: qty 2

## 2023-04-01 NOTE — H&P (Signed)
Chief Complaint: Patient was seen in consultation today for lymphoma  Referring Physician(s): Ennever,Peter R  Supervising Physician: Malachy Moan  Patient Status: [facility] - Out-pt  History of Present Illness: Bonnie Russell is a 57 y.o. female with a past medical history significant for anxiety, depression and recently diagnosed with follicular lymphoma who presents today for port placement. [name] noted a swollen lymph node in her right groin earlier this year and subsequently underwent a CT abd/pelvis 01/22/23 which showed lymphadenopathy. She then underwent a lymph node biopsy 03/03/23 and pathology showed follicular non-Hodgkin's lymphoma. She was seen by oncology and underwent initial PET scan 03/23/23 which showed:  1. Hypermetabolic adenopathy in the neck (Deauville 4), chest (Deauville 4), and abdomen and pelvis (Deauville 5). The heaviest burden of enlarged hypermetabolic lymph nodes is in the right hemipelvis. 2. No splenomegaly or hypermetabolic splenic activity. 3. 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  She is planned to undergo systemic therapy and presents to IR today for port placement for durable venous access.  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 Authorizing Provider  ALPRAZolam Prudy Feeler) 0.5 MG tablet Take 1 tablet (0.5 mg total) by mouth 2 (two) times daily as needed for anxiety 01/22/23     amLODipine (NORVASC) 5 MG tablet Take 1 tablet (5 mg total) by mouth daily. 03/12/23     aspirin-acetaminophen-caffeine (EXCEDRIN MIGRAINE) 250-250-65 MG tablet Take by mouth every 6 (six) hours as needed for headache.    [provider]  dexamethasone (DECADRON) 4 MG tablet Take 2 tablets (8 mg total) by mouth daily. Start the day after bendamustine chemotherapy for 2 days. Take with food. 03/24/23   Josph Macho, MD  diphenhydrAMINE HCl, Sleep, (ZZZQUIL) 50 MG/30ML LIQD Take 1 Capful by mouth at bedtime as needed.    [provider]  DULoxetine (CYMBALTA) 60 MG capsule Take 1 capsule (60 mg total) by mouth daily. 10/10/20     metoprolol succinate (TOPROL-XL) 50 MG 24 hr tablet Take 1 tablet by mouth daily. 01/07/23   [provider]  Multiple Vitamin (MULTIVITAMIN WITH MINERALS) TABS tablet Take 1 tablet by mouth daily. 08/31/21   Marguerita Merles Latif, DO  ondansetron (ZOFRAN) 8 MG tablet Take 1 tablet (8 mg total) by mouth every 8 (eight) hours as needed for nausea or vomiting. Start on the third day after chemotherapy. 03/24/23   Josph Macho, MD  prochlorperazine (COMPAZINE) 10 MG tablet Take 1 tablet (10 mg total) by mouth every 6 (six) hours as needed for nausea or vomiting. 03/24/23   Josph Macho, MD  valACYclovir (VALTREX) 500 MG tablet Take 500 mg by mouth daily.    [provider]  Family History  Problem Relation Age of Onset   Throat cancer Paternal Grandfather     Social History   Socioeconomic History   Marital status: Legally Separated    Spouse name: Not on file   Number of children: Not on file   Years of education: Not on file   Highest education level: Not on file  Occupational History   Not on file  Tobacco Use    Smoking status: Never   Smokeless tobacco: Never  Vaping Use   Vaping status: Never Used  Substance and Sexual Activity   Alcohol use: Yes    Comment: 2-3 beers/wine weekly   Drug use: No   Sexual activity: Not on file  Other Topics Concern   Not on file  Social History Narrative   Not on file   Social Determinants of Health   Financial Resource Strain: Low Risk  (03/25/2023)   Overall Financial Resource Strain (CARDIA)    Difficulty of Paying Living Expenses: Not hard at all  Food Insecurity: Medium Risk (12/31/2022)   Received from [facility]   Hunger Vital Sign    Worried About Running Out of Food in the Last Year: Never true    Ran Out of Food in the Last Year: Sometimes true  Transportation Needs: No Transportation Needs (03/25/2023)   PRAPARE - Administrator, Civil Service (Medical): No    Lack of Transportation (Non-Medical): No  Physical Activity: Insufficiently Active (09/28/2019)   Received from [facility] visits prior to 07/26/2022.   Exercise Vital Sign    Days of Exercise per Week: 1 day    Minutes of Exercise per Session: 60 min  Stress: No Stress Concern Present (09/28/2019)   Received from [facility] visits prior to 07/26/2022.   [employer] of Occupational Health - Occupational Stress Questionnaire    Feeling of Stress : Only a little  Social Connections: Socially Integrated (09/28/2019)   Received from [facility] visits prior to 07/26/2022.   Social Advertising account executive [NHANES]    Frequency of Communication with Friends and Family: More than three times a week    Frequency of Social Gatherings with Friends and Family: Once a week    Attends Religious Services: 1 to 4 times per year    Active Member of [employer] or Organizations: Yes    Attends Engineer, structural: More than 4 times per year    Marital Status: Married     Review of Systems: A 12 point ROS  discussed and pertinent positives are indicated in the HPI above.  All other systems are negative.  Review of Systems  Constitutional:  Negative for chills and fever.  Respiratory:  Negative for cough and shortness of breath.   Cardiovascular:  Negative for chest pain.  Gastrointestinal:  Negative for abdominal pain, diarrhea, nausea and vomiting.  Musculoskeletal:  Negative for back pain.  Neurological:  Negative for dizziness and headaches.    Vital Signs: BP (!) 134/96   Pulse 86   Temp 97.9 F (36.6 C) (Oral)   Resp 18   Ht 5\' 3"  (1.6 m)   Wt 207 lb (93.9 kg)   LMP 10/02/2020 (Approximate)   SpO2 96%   BMI 36.67 kg/m   Physical Exam Vitals reviewed.  Constitutional:      General: She is not in acute distress. HENT:     Head: Normocephalic.     Mouth/Throat:  Mouth: Mucous membranes are moist.     Pharynx: Oropharynx is clear. No oropharyngeal exudate or posterior oropharyngeal erythema.  Cardiovascular:     Rate and Rhythm: Normal rate and regular rhythm.  Pulmonary:     Effort: Pulmonary effort is normal.     Breath sounds: Normal breath sounds.  Abdominal:     General: There is no distension.     Palpations: Abdomen is soft.     Tenderness: There is no abdominal tenderness.  Skin:    General: Skin is warm and dry.  Neurological:     Mental Status: She is alert and oriented to person, place, and time.  Psychiatric:        Mood and Affect: Mood normal.        Behavior: Behavior normal.        Thought Content: Thought content normal.        Judgment: Judgment normal.      MD Evaluation Airway: WNL Heart: WNL Abdomen: WNL Chest/ Lungs: WNL ASA  Classification: 2 Mallampati/Airway Score: One   Imaging: NM PET Image Initial (PI) Skull Base To Thigh  Result Date: 03/27/2023 CLINICAL DATA:  Initial treatment strategy for follicular lymphoma. EXAM: NUCLEAR MEDICINE PET SKULL BASE TO THIGH TECHNIQUE: 10.3 mCi F-18 FDG was injected intravenously.  Full-ring PET imaging was performed from the skull base to thigh after the radiotracer. CT data was obtained and used for attenuation correction and anatomic localization. Fasting blood glucose: One hundred nineteen mg/dl COMPARISON:  CT examinations 08/29/2021 and 11/06/2020 FINDINGS: Mediastinal blood pool activity: SUV max 2.2 Liver activity: SUV max 4.0 NECK: Right posterior parotid lymph node 0.6 cm in short axis on image 21 series 4, maximum SUV 4.3, Deauville 4. Clustered left level IIa lymph nodes include a 0.8 cm in short axis node on image 28 series 4, maximum SUV 7.5, Deauville 4. Incidental CT findings: Mucous retention cyst in the right maxillary sinus. CHEST: 0.6 cm right supraclavicular node on image 49 series 4, maximum SUV 4.9, Deauville 4. 0.7 cm right supraclavicular node on image 53 series 4, maximum SUV 3.9, Deauville 3. Incidental CT findings: No significant findings. ABDOMEN/PELVIS: Precaval node 1.5 cm in short axis on image 136 series 4, maximum SUV 8.3, Deauville 5. Other smaller hypermetabolic retroperitoneal and right gastric lymph nodes are present in the upper abdomen. Hypermetabolic right external iliac node measuring 2.4 cm in short axis on image 170 of series 4, maximum SUV 10.3, Deauville 5. Hypermetabolic right inguinal lymph node 2.2 cm in short axis on image 175 series 4, maximum SUV 10.3, Deauville 5. Other hypermetabolic right inguinal, bilateral pelvic sidewall, and common iliac lymph nodes are present although adenopathy in the pelvis favors the right side. Incidental CT findings: No splenomegaly or hypermetabolic splenic activity. SKELETON: No significant abnormal hypermetabolic activity in this region. 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 IMPRESSION: 1. Hypermetabolic adenopathy in the neck (Deauville 4), chest (Deauville 4), and abdomen and pelvis (Deauville 5). The heaviest burden of enlarged  hypermetabolic lymph nodes is in the right hemipelvis. 2. No splenomegaly or hypermetabolic splenic activity. 3. 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 Electronically Signed   By: Gaylyn Rong M.D.   On: 03/27/2023 15:16    Labs:  CBC: Recent Labs    03/10/23 1320  WBC 6.5  HGB 14.8  HCT 42.8  PLT 222    COAGS: No results for  input(s): "INR", "APTT" in the last 8760 hours.  BMP: Recent Labs    03/10/23 1320  NA 139  K 3.9  CL 101  CO2 27  GLUCOSE 102*  BUN 12  CALCIUM 9.6  CREATININE 1.11*  GFRNONAA 58*    LIVER FUNCTION TESTS: Recent Labs    03/10/23 1320  BILITOT 0.5  AST 21  ALT 14  ALKPHOS 93  PROT 7.5  ALBUMIN 4.5    TUMOR MARKERS: No results for input(s): "AFPTM", "CEA", "CA199", "CHROMGRNA" in the last 8760 hours.  Assessment and Plan:  57 y/o F with recently diagnosed follicular lymphoma who presents today for port placement prior to beginning systemic therapy.  Risks and benefits of image-guided Port-a-catheter placement were discussed with the patient including, but not limited to bleeding, infection, pneumothorax, or fibrin sheath development and need for additional procedures.  All of the patient's questions were answered, patient is agreeable to proceed.  Consent signed and in chart.  Thank you for this interesting consult.  I greatly enjoyed meeting Bonnie Russell and look forward to participating in their care.  A copy of this report was sent to the requesting provider on this date.  Electronically Signed: Villa Herb, PA-C 04/01/2023, 10:59 AM   I spent a total of 30 Minutes  in face to face in clinical consultation, greater than 50% of which was counseling/coordinating care for lymphoma.

## 2023-04-01 NOTE — Discharge Instructions (Addendum)
Discharge Instructions:   Please call Interventional Radiology clinic [phone] with any questions or concerns.  You may remove your bandaid to the right upper neck,dressing to right upper chest and shower tomorrow.  Do not use EMLA / Lidocaine cream for 2 weeks post Port Insertion this will remove the surgical glue.   Implanted Port Insertion, Care After The following information offers guidance on how to care for yourself after your procedure. Your health care provider may also give you more specific instructions. If you have problems or questions, contact your health care provider. What can I expect after the procedure? After the procedure, it is common to have: Discomfort at the port insertion site. Bruising on the skin over the port. This should improve over 3-4 days. Follow these instructions at home: [facility] care After your port is placed, you will get a manufacturer's information card. The card has information about your port. Keep this card with you at all times. Take care of the port as told by your health care provider. Ask your health care provider if you or a family member can get training for taking care of the port at home. A home health care nurse will be be available to help care for the port. Make sure to remember what type of port you have. Incision care     Follow instructions from your health care provider about how to take care of your port insertion site. Make sure you: Wash your hands with soap and water for at least 20 seconds before and after you change your bandage (dressing). If soap and water are not available, use hand sanitizer. Change your dressing as told by your health care provider. Leave stitches (sutures), skin glue, or adhesive strips in place. These skin closures may need to stay in place for 2 weeks or longer. If adhesive strip edges start to loosen and curl up, you may trim the loose edges. Do not remove adhesive strips completely unless your  health care provider tells you to do that. Check your port insertion site every day for signs of infection. Check for: Redness, swelling, or pain. Fluid or blood. Warmth. Pus or a bad smell. Activity Return to your normal activities as told by your health care provider. Ask your health care provider what activities are safe for you. You may have to avoid lifting. Ask your health care provider how much you can safely lift. General instructions Take over-the-counter and prescription medicines only as told by your health care provider. Do not take baths, swim, or use a hot tub until your health care provider approves. Ask your health care provider if you may take showers. You may only be allowed to take sponge baths. If you were given a sedative during the procedure, it can affect you for several hours. Do not drive or operate machinery until your health care provider says that it is safe. Wear a medical alert bracelet in case of an emergency. This will tell any health care providers that you have a port. Keep all follow-up visits. This is important. Contact a health care provider if: You cannot flush your port with saline as directed, or you cannot draw blood from the port. You have a fever or chills. You have redness, swelling, or pain around your port insertion site. You have fluid or blood coming from your port insertion site. Your port insertion site feels warm to the touch. You have pus or a bad smell coming from the port insertion site. Get help right  away if: You have chest pain or shortness of breath. You have bleeding from your port that you cannot control. These symptoms may be an emergency. Get help right away. Call 911. Do not wait to see if the symptoms will go away. Do not drive yourself to the hospital. Summary Take care of the port as told by your health care provider. Keep the manufacturer's information card with you at all times. Change your dressing as told by your  health care provider. Contact a health care provider if you have a fever or chills or if you have redness, swelling, or pain around your port insertion site. Keep all follow-up visits. This information is not intended to replace advice given to you by your health care provider. Make sure you discuss any questions you have with your health care provider. Document Revised: 11/13/2020 Document Reviewed: 11/13/2020 Elsevier Patient Education  2023 Elsevier Inc.   Moderate Conscious Sedation, Adult, Care After This sheet gives you information about how to care for yourself after your procedure. Your health care provider may also give you more specific instructions. If you have problems or questions, contact your health care provider. What can I expect after the procedure? After the procedure, it is common to have: Sleepiness for several hours. Impaired judgment for several hours. Difficulty with balance. Vomiting if you eat too soon. Follow these instructions at home: For the time period you were told by your health care provider: Rest. Do not participate in activities where you could fall or become injured. Do not drive or use machinery. Do not drink alcohol. Do not take sleeping pills or medicines that cause drowsiness. Do not make important decisions or sign legal documents. Do not take care of children on your own. Eating and drinking  Follow the diet recommended by your health care provider. Drink enough fluid to keep your urine pale yellow. If you vomit: Drink water, juice, or soup when you can drink without vomiting. Make sure you have little or no nausea before eating solid foods. General instructions Take over-the-counter and prescription medicines only as told by your health care provider. Have a responsible adult stay with you for the time you are told. It is important to have someone help care for you until you are awake and alert. Do not smoke. Keep all follow-up visits  as told by your health care provider. This is important. Contact a health care provider if: You are still sleepy or having trouble with balance after 24 hours. You feel light-headed. You keep feeling nauseous or you keep vomiting. You develop a rash. You have a fever. You have redness or swelling around the IV site. Get help right away if: You have trouble breathing. You have new-onset confusion at home. Summary After the procedure, it is common to feel sleepy, have impaired judgment, or feel nauseous if you eat too soon. Rest after you get home. Know the things you should not do after the procedure. Follow the diet recommended by your health care provider and drink enough fluid to keep your urine pale yellow. Get help right away if you have trouble breathing or new-onset confusion at home. This information is not intended to replace advice given to you by your health care provider. Make sure you discuss any questions you have with your health care provider. Document Revised: 09/09/2019 Document Reviewed: 04/07/2019 Elsevier Patient Education  2023 [employer].

## 2023-04-01 NOTE — Progress Notes (Signed)
1530 Ice bag given to take home to use for comfort as needed.

## 2023-04-02 ENCOUNTER — Inpatient Hospital Stay: Payer: BC Managed Care – PPO | Admitting: Hematology & Oncology

## 2023-04-02 ENCOUNTER — Encounter: Payer: Self-pay | Admitting: Hematology & Oncology

## 2023-04-02 ENCOUNTER — Inpatient Hospital Stay: Payer: BC Managed Care – PPO | Attending: Hematology & Oncology

## 2023-04-02 ENCOUNTER — Encounter: Payer: Self-pay | Admitting: *Deleted

## 2023-04-02 ENCOUNTER — Inpatient Hospital Stay: Payer: BC Managed Care – PPO

## 2023-04-02 VITALS — BP 135/89 | HR 93 | Temp 97.9°F | Resp 20

## 2023-04-02 DIAGNOSIS — Z5112 Encounter for antineoplastic immunotherapy: Secondary | ICD-10-CM | POA: Diagnosis present

## 2023-04-02 DIAGNOSIS — C8218 Follicular lymphoma grade II, lymph nodes of multiple sites: Secondary | ICD-10-CM

## 2023-04-02 DIAGNOSIS — Z5111 Encounter for antineoplastic chemotherapy: Secondary | ICD-10-CM | POA: Insufficient documentation

## 2023-04-02 DIAGNOSIS — C8299 Follicular lymphoma, unspecified, extranodal and solid organ sites: Secondary | ICD-10-CM

## 2023-04-02 DIAGNOSIS — C8290 Follicular lymphoma, unspecified, unspecified site: Secondary | ICD-10-CM | POA: Insufficient documentation

## 2023-04-02 LAB — CBC WITH DIFFERENTIAL (CANCER CENTER ONLY)
Abs Immature Granulocytes: 0.01 10*3/uL (ref 0.00–0.07)
Basophils Absolute: 0.1 10*3/uL (ref 0.0–0.1)
Basophils Relative: 1 %
Eosinophils Absolute: 0.1 10*3/uL (ref 0.0–0.5)
Eosinophils Relative: 2 %
HCT: 37.8 % (ref 36.0–46.0)
Hemoglobin: 13.2 g/dL (ref 12.0–15.0)
Immature Granulocytes: 0 %
Lymphocytes Relative: 31 %
Lymphs Abs: 1.8 10*3/uL (ref 0.7–4.0)
MCH: 32.2 pg (ref 26.0–34.0)
MCHC: 34.9 g/dL (ref 30.0–36.0)
MCV: 92.2 fL (ref 80.0–100.0)
Monocytes Absolute: 0.5 10*3/uL (ref 0.1–1.0)
Monocytes Relative: 9 %
Neutro Abs: 3.2 10*3/uL (ref 1.7–7.7)
Neutrophils Relative %: 57 %
Platelet Count: 144 10*3/uL — ABNORMAL LOW (ref 150–400)
RBC: 4.1 MIL/uL (ref 3.87–5.11)
RDW: 12.2 % (ref 11.5–15.5)
WBC Count: 5.7 10*3/uL (ref 4.0–10.5)
nRBC: 0 % (ref 0.0–0.2)

## 2023-04-02 LAB — LACTATE DEHYDROGENASE: LDH: 155 U/L (ref 98–192)

## 2023-04-02 LAB — CMP (CANCER CENTER ONLY)
ALT: 11 U/L (ref 0–44)
AST: 18 U/L (ref 15–41)
Albumin: 4.7 g/dL (ref 3.5–5.0)
Alkaline Phosphatase: 80 U/L (ref 38–126)
Anion gap: 9 (ref 5–15)
BUN: 12 mg/dL (ref 6–20)
CO2: 27 mmol/L (ref 22–32)
Calcium: 9.2 mg/dL (ref 8.9–10.3)
Chloride: 100 mmol/L (ref 98–111)
Creatinine: 0.91 mg/dL (ref 0.44–1.00)
GFR, Estimated: >60 mL/min (ref >60)
Glucose, Bld: 111 mg/dL — ABNORMAL HIGH (ref 70–99)
Potassium: 3.5 mmol/L (ref 3.5–5.1)
Sodium: 136 mmol/L (ref 135–145)
Total Bilirubin: 0.6 mg/dL (ref <1.2)
Total Protein: 7.1 g/dL (ref 6.5–8.1)

## 2023-04-02 LAB — HEPATITIS PANEL, ACUTE
HCV Ab: NONREACTIVE
Hep A IgM: NONREACTIVE
Hep B C IgM: NONREACTIVE
Hepatitis B Surface Ag: NONREACTIVE

## 2023-04-02 LAB — HEPATITIS B CORE ANTIBODY, TOTAL: Hep B Core Total Ab: NONREACTIVE

## 2023-04-02 LAB — HEPATITIS B SURFACE ANTIGEN: Hepatitis B Surface Ag: NONREACTIVE

## 2023-04-02 LAB — URIC ACID: Uric Acid, Serum: 6.5 mg/dL (ref 2.5–7.1)

## 2023-04-02 MED ORDER — ACETAMINOPHEN 325 MG PO TABS
650.0000 mg | ORAL_TABLET | Freq: Once | ORAL | Status: AC
Start: 2023-04-02 — End: 2023-04-02
  Administered 2023-04-02: 650 mg via ORAL
  Filled 2023-04-02: qty 2

## 2023-04-02 MED ORDER — METHYLPREDNISOLONE SODIUM SUCC 125 MG IJ SOLR
125.0000 mg | Freq: Once | INTRAMUSCULAR | Status: AC | PRN
  Administered 2023-04-02: 125 mg via INTRAVENOUS

## 2023-04-02 MED ORDER — RITUXIMAB-PVVR CHEMO 500 MG/50ML IV SOLN
375.0000 mg/m2 | Freq: Once | INTRAVENOUS | Status: AC
  Administered 2023-04-02: 800 mg via INTRAVENOUS
  Filled 2023-04-02: qty 80
  Filled 2023-04-02: qty 50

## 2023-04-02 MED ORDER — SODIUM CHLORIDE 0.9 % IV SOLN
90.0000 mg/m2 | Freq: Once | INTRAVENOUS | Status: AC
  Administered 2023-04-02: 200 mg via INTRAVENOUS
  Filled 2023-04-02: qty 8

## 2023-04-02 MED ORDER — DIPHENHYDRAMINE HCL 25 MG PO CAPS
50.0000 mg | ORAL_CAPSULE | Freq: Once | ORAL | Status: AC
Start: 2023-04-02 — End: 2023-04-02
  Administered 2023-04-02: 50 mg via ORAL
  Filled 2023-04-02: qty 2

## 2023-04-02 MED ORDER — SODIUM CHLORIDE 0.9% FLUSH
10.0000 mL | INTRAVENOUS | Status: DC | PRN
Start: 2023-04-02 — End: 2023-04-02
  Administered 2023-04-02: 10 mL

## 2023-04-02 MED ORDER — SODIUM CHLORIDE 0.9 % IV SOLN: INTRAVENOUS | Status: DC

## 2023-04-02 MED ORDER — FAMOTIDINE IN NACL 20-0.9 MG/50ML-% IV SOLN
20.0000 mg | Freq: Once | INTRAVENOUS | Status: AC | PRN
  Administered 2023-04-02: 20 mg via INTRAVENOUS

## 2023-04-02 MED ORDER — HEPARIN SOD (PORK) LOCK FLUSH 100 UNIT/ML IV SOLN
500.0000 [IU] | Freq: Once | INTRAVENOUS | Status: AC | PRN
Start: 2023-04-02 — End: 2023-04-02
  Administered 2023-04-02: 500 [IU]

## 2023-04-02 MED ORDER — PALONOSETRON HCL INJECTION 0.25 MG/5ML
0.2500 mg | Freq: Once | INTRAVENOUS | Status: AC
  Administered 2023-04-02: 0.25 mg via INTRAVENOUS
  Filled 2023-04-02: qty 5

## 2023-04-02 MED ORDER — DEXAMETHASONE SODIUM PHOSPHATE 10 MG/ML IJ SOLN
10.0000 mg | Freq: Once | INTRAMUSCULAR | Status: AC
  Administered 2023-04-02: 10 mg via INTRAVENOUS
  Filled 2023-04-02: qty 1

## 2023-04-02 NOTE — Progress Notes (Signed)
1145-Pt complaining of itching inside of ears and scratchy throat.  Rituxan stopped and Clent Jacks PA to chairside to assess pt.  Order received to give pt Solumedrol 125 mg IV and Pepcid 20 mg IV now and when itching symptoms resolve, to wait 15 minutes until pt is without itching or complaints and restart Rituxan at earlier rate of 62 ml/hr.  1230-Pt without complaints of itching times 20 minutes.  Rituxan restarted at 62 ml/hr.

## 2023-04-02 NOTE — Progress Notes (Signed)
Patient is here to start treatment. She is nervous but feels ready to start. She had chemo education last week. Her port was placed yesterday.   She knows to call the office with any questions or concerns. She will be back in the office tomorrow for day 2.   Oncology Nurse Navigator Documentation     04/02/2023    8:30 AM  Oncology Nurse Navigator Flowsheets  Phase of Treatment Chemo  Chemotherapy Actual Start Date: 04/02/2023  Chemotherapy Expected End Date: 08/21/2023  Navigator Follow Up Date: 04/30/2023  Navigator Follow Up Reason: Follow-up Appointment;Chemotherapy  Navigator Location [facility]  Navigator Encounter Type Treatment  Treatment Initiated Date 04/02/2023  Patient Visit Type MedOnc  Treatment Phase First Chemo Tx  Barriers/Navigation Needs Coordination of Care;Education  Interventions Psycho-Social Support  Acuity Level 2-Minimal Needs (1-2 Barriers Identified)  Support Groups/Services Friends and Family  Time Spent with Patient 15

## 2023-04-02 NOTE — Patient Instructions (Signed)
[facility] - A DEPT OF [facility]  Discharge Instructions: Thank you for choosing [facility] to provide your oncology and hematology care.   If you have a lab appointment with the Cancer Center, please go directly to the Cancer Center and check in at the registration area.  Wear comfortable clothing and clothing appropriate for easy access to any Portacath or PICC line.   We strive to give you quality time with your provider. You may need to reschedule your appointment if you arrive late (15 or more minutes).  Arriving late affects you and other patients whose appointments are after yours.  Also, if you miss three or more appointments without notifying the office, you may be dismissed from the clinic at the provider's discretion.      For prescription refill requests, have your pharmacy contact our office and allow 72 hours for refills to be completed.    Today you received the following chemotherapy and/or immunotherapy agents:  Rituxan and Bendeka      To help prevent nausea and vomiting after your treatment, we encourage you to take your nausea medication as directed.  BELOW ARE SYMPTOMS THAT SHOULD BE REPORTED IMMEDIATELY: *FEVER GREATER THAN 100.4 F (38 C) OR HIGHER *CHILLS OR SWEATING *NAUSEA AND VOMITING THAT IS NOT CONTROLLED WITH YOUR NAUSEA MEDICATION *UNUSUAL SHORTNESS OF BREATH *UNUSUAL BRUISING OR BLEEDING *URINARY PROBLEMS (pain or burning when urinating, or frequent urination) *BOWEL PROBLEMS (unusual diarrhea, constipation, pain near the anus) TENDERNESS IN MOUTH AND THROAT WITH OR WITHOUT PRESENCE OF ULCERS (sore throat, sores in mouth, or a toothache) UNUSUAL RASH, SWELLING OR PAIN  UNUSUAL VAGINAL DISCHARGE OR ITCHING   Items with * indicate a potential emergency and should be followed up as soon as possible or go to the Emergency Department if any problems should occur.  Please show the CHEMOTHERAPY ALERT CARD or  IMMUNOTHERAPY ALERT CARD at check-in to the Emergency Department and triage nurse. Should you have questions after your visit or need to cancel or reschedule your appointment, please contact [facility] - A DEPT OF [facility]  [phone] and follow the prompts.  Office hours are 8:00 a.m. to 4:30 p.m. Monday - Friday. Please note that voicemails left after 4:00 p.m. may not be returned until the following business day.  We are closed weekends and major holidays. You have access to a nurse at all times for urgent questions. Please call the main number to the clinic [phone] and follow the prompts.  For any non-urgent questions, you may also contact your provider using MyChart. We now offer e-Visits for anyone 46 and older to request care online for non-urgent symptoms. For details visit [email or web address].   Also download the MyChart app! Go to the app store, search "MyChart", open the app, select [facility], and log in with your MyChart username and password.

## 2023-04-02 NOTE — Progress Notes (Signed)
Ok to proceed with treatment today with Hep B panel pending.  Anola Gurney Farmer, [place], BCPS, BCOP 04/02/2023 8:58 AM

## 2023-04-02 NOTE — Progress Notes (Signed)
OK to proceed with treatment today without urine pregnancy per order of Dr. Myna Hidalgo.  Pt is post-menopausal as of 2022.

## 2023-04-02 NOTE — Progress Notes (Signed)
Hematology and Oncology Follow Up Visit  Bonnie Russell [id number] August 09, 1965 57 y.o. 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  She will start chemotherapy today.  She is doing quite well.  She had a Port-A-Cath placed yesterday.  We did go ahead and do a PET scan on her.  This is on 03/23/2023.  The PET scan showed that she had multiple lymph node areas that were active.  None the lymph nodes were bulky.  The largest lymph node probably measured 2.4 cm in size.  There is no splenomegaly.  Again, I feel very confident that she will have a complete response.  She has had no issues with nausea or vomiting.  There is no fever.  No sweats.  She has had no cough or shortness of breath.  She has had no bleeding.  There is been no change in bowel or bladder habits.  Overall, I would say performance status is probably ECOG 0.  Medications:  Current Outpatient Medications:    ALPRAZolam (XANAX) 0.5 MG tablet, Take 1 tablet (0.5 mg total) by mouth 2 (two) times daily as needed for anxiety, Disp: 30 tablet, Rfl: 2   amLODipine (NORVASC) 5 MG tablet, Take 1 tablet (5 mg total) by mouth daily., Disp: 30 tablet, Rfl: 1   aspirin-acetaminophen-caffeine (EXCEDRIN MIGRAINE) 250-250-65 MG tablet, Take by mouth every 6 (six) hours as needed for headache., Disp: , Rfl:    diphenhydrAMINE HCl, Sleep, (ZZZQUIL) 50 MG/30ML LIQD, Take 1 Capful by mouth at bedtime as needed., Disp: , Rfl:    DULoxetine (CYMBALTA) 60 MG capsule, Take 1 capsule (60 mg total) by mouth daily., Disp: 90 capsule, Rfl: 2   metoprolol succinate (TOPROL-XL) 50 MG 24 hr tablet, Take 1 tablet by mouth daily., Disp: , Rfl:    Multiple Vitamin (MULTIVITAMIN WITH MINERALS) TABS tablet, Take 1 tablet by mouth daily., Disp: 30 tablet, Rfl: 0   valACYclovir  (VALTREX) 500 MG tablet, Take 500 mg by mouth daily., Disp: , Rfl:    dexamethasone (DECADRON) 4 MG tablet, Take 2 tablets (8 mg total) by mouth daily. Start the day after bendamustine chemotherapy for 2 days. Take with food. (Patient not taking: Reported on 04/02/2023), Disp: 30 tablet, Rfl: 1   ondansetron (ZOFRAN) 8 MG tablet, Take 1 tablet (8 mg total) by mouth every 8 (eight) hours as needed for nausea or vomiting. Start on the third day after chemotherapy. (Patient not taking: Reported on 04/02/2023), Disp: 30 tablet, Rfl: 1   prochlorperazine (COMPAZINE) 10 MG tablet, Take 1 tablet (10 mg total) by mouth every 6 (six) hours as needed for nausea or vomiting. (Patient not taking: Reported on 04/02/2023), Disp: 30 tablet, Rfl: 1  Allergies:  Allergies  Allergen Reactions   Morphine Itching   Sertraline Other (See Comments) and Diarrhea    Diarrhea   Hydroxyzine     Did not like how it made her feel   Morphine And Codeine Itching   Peanut (Diagnostic) Other (See Comments)    Occasionally causes "eye swelling"   Shellfish Allergy Other (See Comments)    Occasionally causes "eye swelling"    Past Medical History, Surgical history, Social history, and Family History were reviewed and updated.  Review of Systems: Review of Systems  Constitutional: Negative.   HENT:  Negative.    Eyes: Negative.  Respiratory: Negative.    Cardiovascular: Negative.   Gastrointestinal: Negative.   Endocrine: Negative.   Genitourinary: Negative.    Musculoskeletal: Negative.   Skin: Negative.   Neurological: Negative.   Hematological: Negative.   Psychiatric/Behavioral: Negative.      Physical Exam:  height is 5' 3.5" (1.613 m) and weight is 209 lb 6.4 oz (95 kg). Her oral temperature is 97.9 F (36.6 C). Her blood pressure is 140/90 (abnormal) and her pulse is 92. Her respiration is 20 and oxygen saturation is 98%.   Wt Readings from Last 3 Encounters:  04/02/23 209 lb 6.4 oz (95 kg)  04/01/23  207 lb (93.9 kg)  03/10/23 207 lb (93.9 kg)    Physical Exam Vitals reviewed.  HENT:     Head: Normocephalic and atraumatic.  Eyes:     Pupils: Pupils are equal, round, and reactive to light.  Cardiovascular:     Rate and Rhythm: Normal rate and regular rhythm.     Heart sounds: Normal heart sounds.  Pulmonary:     Effort: Pulmonary effort is normal.     Breath sounds: Normal breath sounds.  Abdominal:     General: Bowel sounds are normal.     Palpations: Abdomen is soft.  Musculoskeletal:        General: No tenderness or deformity. Normal range of motion.     Cervical back: Normal range of motion.  Lymphadenopathy:     Cervical: No cervical adenopathy.  Skin:    General: Skin is warm and dry.     Findings: No erythema or rash.  Neurological:     Mental Status: She is alert and oriented to person, place, and time.  Psychiatric:        Behavior: Behavior normal.        Thought Content: Thought content normal.        Judgment: Judgment normal.      Lab Results  Component Value Date   WBC 6.5 03/10/2023   HGB 14.8 03/10/2023   HCT 42.8 03/10/2023   MCV 93.0 03/10/2023   PLT 222 03/10/2023     Chemistry      Component Value Date/Time   NA 139 03/10/2023 1320   K 3.9 03/10/2023 1320   CL 101 03/10/2023 1320   CO2 27 03/10/2023 1320   BUN 12 03/10/2023 1320   CREATININE 1.11 (H) 03/10/2023 1320      Component Value Date/Time   CALCIUM 9.6 03/10/2023 1320   ALKPHOS 93 03/10/2023 1320   AST 21 03/10/2023 1320   ALT 14 03/10/2023 1320   BILITOT 0.5 03/10/2023 1320       Impression and Plan: Bonnie Russell is a very nice 57 year old white female.  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  She has an intermediate prognostic scale.  Again, I feel that we can get her into remission that is long-lasting with Rituxan/Bendamustine.  I probably would repeat another PET scan after her second cycle of treatment.  I will plan to  see her back when she has her second cycle of treatment in 1 month.  I know that she and her family will have a wonderful Thanksgiving.   Josph Macho, MD 11/7/20248:23 AM

## 2023-04-03 ENCOUNTER — Inpatient Hospital Stay: Payer: BC Managed Care – PPO

## 2023-04-03 VITALS — BP 137/83 | HR 91 | Temp 97.8°F | Resp 17

## 2023-04-03 DIAGNOSIS — C8218 Follicular lymphoma grade II, lymph nodes of multiple sites: Secondary | ICD-10-CM

## 2023-04-03 DIAGNOSIS — Z5112 Encounter for antineoplastic immunotherapy: Secondary | ICD-10-CM | POA: Diagnosis not present

## 2023-04-03 MED ORDER — DEXAMETHASONE SODIUM PHOSPHATE 10 MG/ML IJ SOLN
10.0000 mg | Freq: Once | INTRAMUSCULAR | Status: AC
  Administered 2023-04-03: 10 mg via INTRAVENOUS
  Filled 2023-04-03: qty 1

## 2023-04-03 MED ORDER — SODIUM CHLORIDE 0.9 % IV SOLN
90.0000 mg/m2 | Freq: Once | INTRAVENOUS | Status: AC
  Administered 2023-04-03: 200 mg via INTRAVENOUS
  Filled 2023-04-03: qty 8

## 2023-04-03 MED ORDER — SODIUM CHLORIDE 0.9 % IV SOLN
INTRAVENOUS | Status: DC
Start: 2023-04-03 — End: 2023-04-03

## 2023-04-03 MED ORDER — HEPARIN SOD (PORK) LOCK FLUSH 100 UNIT/ML IV SOLN
500.0000 [IU] | Freq: Once | INTRAVENOUS | Status: AC | PRN
  Administered 2023-04-03: 500 [IU]

## 2023-04-03 MED ORDER — SODIUM CHLORIDE 0.9% FLUSH
10.0000 mL | INTRAVENOUS | Status: DC | PRN
  Administered 2023-04-03: 10 mL

## 2023-04-03 NOTE — Patient Instructions (Signed)
Bendamustine Injection What is this medication? BENDAMUSTINE (BEN da MUS teen) treats leukemia and lymphoma. It works by slowing down the growth of cancer cells. This medicine may be used for other purposes; ask your health care provider or pharmacist if you have questions. COMMON BRAND NAME(S): Marilynne Halsted, VIVIMUSTA What should I tell my care team before I take this medication? They need to know if you have any of these conditions: Infection, especially a viral infection, such as chickenpox, cold sores, herpes Kidney disease Liver disease An unusual or allergic reaction to bendamustine, mannitol, other medications, foods, dyes, or preservatives Pregnant or trying to get pregnant Breast-feeding How should I use this medication? This medication is injected into a vein. It is given by your care team in a hospital or clinic setting. Talk to your care team about the use of this medication in children. Special care may be needed. Overdosage: If you think you have taken too much of this medicine contact a poison control center or emergency room at once. NOTE: This medicine is only for you. Do not share this medicine with others. What if I miss a dose? Keep appointments for follow-up doses. It is important not to miss your dose. Call your care team if you are unable to keep an appointment. What may interact with this medication? Do not take this medication with any of the following: Clozapine This medication may also interact with the following: Atazanavir Cimetidine Ciprofloxacin Enoxacin Fluvoxamine Medications for seizures, such as carbamazepine, phenobarbital Mexiletine Rifampin Tacrine Thiabendazole Zileuton This list may not describe all possible interactions. Give your health care provider a list of all the medicines, herbs, non-prescription drugs, or dietary supplements you use. Also tell them if you smoke, drink alcohol, or use illegal drugs. Some items may  interact with your medicine. What should I watch for while using this medication? Visit your care team for regular checks on your progress. This medication may make you feel generally unwell. This is not uncommon, as chemotherapy can affect healthy cells as well as cancer cells. Report any side effects. Continue your course of treatment even though you feel ill unless your care team tells you to stop. You may need blood work while taking this medication. This medication may increase your risk of getting an infection. Call your care team for advice if you get a fever, chills, sore throat, or other symptoms of a cold or flu. Do not treat yourself. Try to avoid being around people who are sick. This medication may cause serious skin reactions. They can happen weeks to months after starting the medication. Contact your care team right away if you notice fevers or flu-like symptoms with a rash. The rash may be red or purple and then turn into blisters or peeling of the skin. You may also notice a red rash with swelling of the face, lips, or lymph nodes in your neck or under your arms. In some patients, this medication may cause a serious brain infection that may cause death. If you have any problems seeing, thinking, speaking, walking, or standing, tell your care team right away. If you cannot reach your care team, urgently seek other source of medical care. This medication may increase your risk to bruise or bleed. Call your care team if you notice any unusual bleeding. Talk to your care team about your risk of cancer. You may be more at risk for certain types of cancer if you take this medication. Talk to your care team about your  risk of skin cancer. You may be more at risk for skin cancer if you take this medication. Talk to your care team if you or your partner wish to become pregnant or think either of you might be pregnant. This medication can cause serious birth defects if taken during pregnancy or for  up to 6 months after the last dose. A negative pregnancy test is required before starting this medication. A reliable form of contraception is recommended while taking this medication and for 6 months after the last dose. Talk to your care team about reliable forms of contraception. Wear a condom while taking this medication and for at least 3 months after the last dose. Do not breast-feed while taking this medication or for at least 1 week after the last dose. This medication may cause infertility. Talk to your care team if you are concerned about your fertility. What side effects may I notice from receiving this medication? Side effects that you should report to your care team as soon as possible: Allergic reactions--skin rash, itching, hives, swelling of the face, lips, tongue, or throat Infection--fever, chills, cough, sore throat, wounds that don't heal, pain or trouble when passing urine, general feeling of discomfort or being unwell Infusion reactions--chest pain, shortness of breath or trouble breathing, feeling faint or lightheaded Liver injury--right upper belly pain, loss of appetite, nausea, light-colored stool, dark yellow or brown urine, yellowing skin or eyes, unusual weakness or fatigue Low red blood cell level--unusual weakness or fatigue, dizziness, headache, trouble breathing Painful swelling, warmth, or redness of the skin, blisters or sores at the infusion site Rash, fever, and swollen lymph nodes Redness, blistering, peeling, or loosening of the skin, including inside the mouth Tumor lysis syndrome (TLS)--nausea, vomiting, diarrhea, decrease in the amount of urine, dark urine, unusual weakness or fatigue, confusion, muscle pain or cramps, fast or irregular heartbeat, joint pain Unusual bruising or bleeding Side effects that usually do not require medical attention (report to your care team if they continue or are bothersome): Diarrhea Fatigue Headache Loss of  appetite Nausea Vomiting This list may not describe all possible side effects. Call your doctor for medical advice about side effects. You may report side effects to FDA at 1-800-FDA-1088. Where should I keep my medication? This medication is given in a hospital or clinic. It will not be stored at home. NOTE: This sheet is a summary. It may not cover all possible information. If you have questions about this medicine, talk to your doctor, pharmacist, or health care provider.  2024 Elsevier/Gold Standard (2021-09-03 00:00:00)

## 2023-04-06 ENCOUNTER — Other Ambulatory Visit (HOSPITAL_COMMUNITY): Payer: Self-pay

## 2023-04-30 ENCOUNTER — Inpatient Hospital Stay: Payer: BC Managed Care – PPO

## 2023-04-30 ENCOUNTER — Other Ambulatory Visit: Payer: Self-pay

## 2023-04-30 ENCOUNTER — Encounter: Payer: Self-pay | Admitting: *Deleted

## 2023-04-30 ENCOUNTER — Inpatient Hospital Stay: Payer: BC Managed Care – PPO | Attending: Hematology & Oncology

## 2023-04-30 ENCOUNTER — Inpatient Hospital Stay (HOSPITAL_BASED_OUTPATIENT_CLINIC_OR_DEPARTMENT_OTHER): Payer: BC Managed Care – PPO | Admitting: Hematology & Oncology

## 2023-04-30 ENCOUNTER — Encounter: Payer: Self-pay | Admitting: Hematology & Oncology

## 2023-04-30 VITALS — BP 119/81 | HR 84 | Temp 98.4°F | Resp 18 | Ht 63.5 in | Wt 206.0 lb

## 2023-04-30 VITALS — BP 109/69 | HR 89 | Temp 97.6°F | Resp 20

## 2023-04-30 DIAGNOSIS — Z5111 Encounter for antineoplastic chemotherapy: Secondary | ICD-10-CM | POA: Insufficient documentation

## 2023-04-30 DIAGNOSIS — C8218 Follicular lymphoma grade II, lymph nodes of multiple sites: Secondary | ICD-10-CM

## 2023-04-30 DIAGNOSIS — C8290 Follicular lymphoma, unspecified, unspecified site: Secondary | ICD-10-CM | POA: Diagnosis present

## 2023-04-30 DIAGNOSIS — Z5112 Encounter for antineoplastic immunotherapy: Secondary | ICD-10-CM | POA: Diagnosis present

## 2023-04-30 LAB — CMP (CANCER CENTER ONLY)
ALT: 20 U/L (ref 0–44)
AST: 21 U/L (ref 15–41)
Albumin: 4.2 g/dL (ref 3.5–5.0)
Alkaline Phosphatase: 81 U/L (ref 38–126)
Anion gap: 11 (ref 5–15)
BUN: 13 mg/dL (ref 6–20)
CO2: 25 mmol/L (ref 22–32)
Calcium: 8.8 mg/dL — ABNORMAL LOW (ref 8.9–10.3)
Chloride: 99 mmol/L (ref 98–111)
Creatinine: 1.14 mg/dL — ABNORMAL HIGH (ref 0.44–1.00)
GFR, Estimated: 56 mL/min — ABNORMAL LOW (ref >60)
Glucose, Bld: 134 mg/dL — ABNORMAL HIGH (ref 70–99)
Potassium: 3.8 mmol/L (ref 3.5–5.1)
Sodium: 135 mmol/L (ref 135–145)
Total Bilirubin: 0.7 mg/dL (ref <1.2)
Total Protein: 7 g/dL (ref 6.5–8.1)

## 2023-04-30 LAB — CBC WITH DIFFERENTIAL (CANCER CENTER ONLY)
Abs Immature Granulocytes: 0.05 10*3/uL (ref 0.00–0.07)
Basophils Absolute: 0.1 10*3/uL (ref 0.0–0.1)
Basophils Relative: 1 %
Eosinophils Absolute: 0.2 10*3/uL (ref 0.0–0.5)
Eosinophils Relative: 2 %
HCT: 34.6 % — ABNORMAL LOW (ref 36.0–46.0)
Hemoglobin: 12.5 g/dL (ref 12.0–15.0)
Immature Granulocytes: 1 %
Lymphocytes Relative: 9 %
Lymphs Abs: 0.7 10*3/uL (ref 0.7–4.0)
MCH: 33.2 pg (ref 26.0–34.0)
MCHC: 36.1 g/dL — ABNORMAL HIGH (ref 30.0–36.0)
MCV: 91.8 fL (ref 80.0–100.0)
Monocytes Absolute: 0.9 10*3/uL (ref 0.1–1.0)
Monocytes Relative: 11 %
Neutro Abs: 5.9 10*3/uL (ref 1.7–7.7)
Neutrophils Relative %: 76 %
Platelet Count: 73 10*3/uL — ABNORMAL LOW (ref 150–400)
RBC: 3.77 MIL/uL — ABNORMAL LOW (ref 3.87–5.11)
RDW: 12.2 % (ref 11.5–15.5)
WBC Count: 7.8 10*3/uL (ref 4.0–10.5)
nRBC: 0 % (ref 0.0–0.2)

## 2023-04-30 LAB — SAVE SMEAR(SSMR), FOR PROVIDER SLIDE REVIEW

## 2023-04-30 LAB — LACTATE DEHYDROGENASE: LDH: 180 U/L (ref 98–192)

## 2023-04-30 MED ORDER — METHYLPREDNISOLONE SODIUM SUCC 125 MG IJ SOLR
60.0000 mg | Freq: Once | INTRAMUSCULAR | Status: AC
Start: 2023-04-30 — End: 2023-04-30
  Administered 2023-04-30: 60 mg via INTRAVENOUS
  Filled 2023-04-30: qty 2

## 2023-04-30 MED ORDER — SODIUM CHLORIDE 0.9 % IV SOLN
81.0000 mg/m2 | Freq: Once | INTRAVENOUS | Status: AC
  Administered 2023-04-30: 165 mg via INTRAVENOUS
  Filled 2023-04-30: qty 6.6

## 2023-04-30 MED ORDER — SODIUM CHLORIDE 0.9 % IV SOLN
375.0000 mg/m2 | Freq: Once | INTRAVENOUS | Status: AC
Start: 2023-04-30 — End: 2023-04-30
  Administered 2023-04-30: 800 mg via INTRAVENOUS
  Filled 2023-04-30: qty 50

## 2023-04-30 MED ORDER — SODIUM CHLORIDE 0.9 % IV SOLN: INTRAVENOUS | Status: DC

## 2023-04-30 MED ORDER — DIPHENHYDRAMINE HCL 25 MG PO CAPS
50.0000 mg | ORAL_CAPSULE | Freq: Once | ORAL | Status: AC
  Administered 2023-04-30: 50 mg via ORAL
  Filled 2023-04-30: qty 2

## 2023-04-30 MED ORDER — DEXAMETHASONE SODIUM PHOSPHATE 10 MG/ML IJ SOLN
10.0000 mg | Freq: Once | INTRAMUSCULAR | Status: AC
  Administered 2023-04-30: 10 mg via INTRAVENOUS
  Filled 2023-04-30: qty 1

## 2023-04-30 MED ORDER — HEPARIN SOD (PORK) LOCK FLUSH 100 UNIT/ML IV SOLN
500.0000 [IU] | Freq: Once | INTRAVENOUS | Status: AC | PRN
  Administered 2023-04-30: 500 [IU]

## 2023-04-30 MED ORDER — SODIUM CHLORIDE 0.9 % IV SOLN
375.0000 mg/m2 | Freq: Once | INTRAVENOUS | Status: DC
  Filled 2023-04-30: qty 80

## 2023-04-30 MED ORDER — PALONOSETRON HCL INJECTION 0.25 MG/5ML
0.2500 mg | Freq: Once | INTRAVENOUS | Status: AC
  Administered 2023-04-30: 0.25 mg via INTRAVENOUS
  Filled 2023-04-30: qty 5

## 2023-04-30 MED ORDER — SODIUM CHLORIDE 0.9 % IV SOLN
40.0000 mg | Freq: Once | INTRAVENOUS | Status: DC
Start: 2023-04-30 — End: 2023-04-30

## 2023-04-30 MED ORDER — FAMOTIDINE IN NACL 20-0.9 MG/50ML-% IV SOLN
20.0000 mg | INTRAVENOUS | Status: AC
Start: 2023-04-30 — End: 2023-04-30
  Administered 2023-04-30 (×2): 20 mg via INTRAVENOUS
  Filled 2023-04-30: qty 50

## 2023-04-30 MED ORDER — SODIUM CHLORIDE 0.9% FLUSH
10.0000 mL | INTRAVENOUS | Status: DC | PRN
  Administered 2023-04-30: 10 mL

## 2023-04-30 MED ORDER — ACETAMINOPHEN 325 MG PO TABS
650.0000 mg | ORAL_TABLET | Freq: Once | ORAL | Status: AC
  Administered 2023-04-30: 650 mg via ORAL
  Filled 2023-04-30: qty 2

## 2023-04-30 NOTE — Progress Notes (Signed)
Patient had good tolerance of cycle one. She will proceed with cycle two today. She will need a PET to assess treatment response. Scheduled for 05/18/2023.  Patient is aware of PET appointment including date, time, and location. The following prep is reviewed with patient and confirmed with teachback: - arrive 30 minutes before appointment time - NPO except water for 6h before scan. No candy, no gum - hold any diabetic medication the morning of the scan - have a low carb dinner the night prior Radiology Information sheet also given and reviewed with patient home for reinforcement of education.   Oncology Nurse Navigator Documentation     04/30/2023    8:15 AM  Oncology Nurse Navigator Flowsheets  Navigator Follow Up Date: 05/18/2023  Navigator Follow Up Reason: Scan Review  Navigator Location [facility]  Navigator Encounter Type Treatment;Appt/Treatment Plan Review  Patient Visit Type MedOnc  Treatment Phase Active Tx  Barriers/Navigation Needs Coordination of Care;Education  Education Other  Interventions Coordination of Care;Education;Psycho-Social Support  Acuity Level 2-Minimal Needs (1-2 Barriers Identified)  Coordination of Care Radiology  Education Method Verbal;Written  Support Groups/Services Friends and Family  Time Spent with Patient 30

## 2023-04-30 NOTE — Progress Notes (Signed)
Order received to give pt Pepcid 40 mg IV and Solumedrol 60 mg IV prior to Rituxan today per order of Dr. Myna Hidalgo.

## 2023-04-30 NOTE — Patient Instructions (Signed)

## 2023-04-30 NOTE — Progress Notes (Signed)
OK to treat with platelet count of 73 per order of Dr.Ennever.

## 2023-04-30 NOTE — Progress Notes (Signed)
Hematology and Oncology Follow Up Visit  Bonnie Russell [id number] 12-07-1965 57 y.o. 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 is back for follow-up.  She tolerated her first cycle of chemotherapy pretty well.  She had a little bit of abdominal dyspepsia.  This is certainly better..  She has had no problems with diarrhea.  She has had no bleeding.  She has had no dysuria.  There has been no cough or shortness of breath.   She has had no fever.  There has been no mouth sores.  She has had no headache.  She and her family at a wonderful Thanksgiving.  Overall, I would say that her performance status is probably ECOG 1.   Medications:  Current Outpatient Medications:    ALPRAZolam (XANAX) 0.5 MG tablet, Take 1 tablet (0.5 mg total) by mouth 2 (two) times daily as needed for anxiety, Disp: 30 tablet, Rfl: 2   amLODipine (NORVASC) 5 MG tablet, Take 1 tablet (5 mg total) by mouth daily., Disp: 30 tablet, Rfl: 1   aspirin-acetaminophen-caffeine (EXCEDRIN MIGRAINE) 250-250-65 MG tablet, Take by mouth every 6 (six) hours as needed for headache., Disp: , Rfl:    dexamethasone (DECADRON) 4 MG tablet, Take 2 tablets (8 mg total) by mouth daily. Start the day after bendamustine chemotherapy for 2 days. Take with food. (Patient not taking: Reported on 04/02/2023), Disp: 30 tablet, Rfl: 1   diphenhydrAMINE HCl, Sleep, (ZZZQUIL) 50 MG/30ML LIQD, Take 1 Capful by mouth at bedtime as needed., Disp: , Rfl:    DULoxetine (CYMBALTA) 60 MG capsule, Take 1 capsule (60 mg total) by mouth daily., Disp: 90 capsule, Rfl: 2   metoprolol succinate (TOPROL-XL) 50 MG 24 hr tablet, Take 1 tablet by mouth daily., Disp: , Rfl:    Multiple Vitamin (MULTIVITAMIN WITH MINERALS) TABS tablet, Take 1 tablet by mouth daily., Disp: 30 tablet, Rfl: 0   ondansetron  (ZOFRAN) 8 MG tablet, Take 1 tablet (8 mg total) by mouth every 8 (eight) hours as needed for nausea or vomiting. Start on the third day after chemotherapy. (Patient not taking: Reported on 04/02/2023), Disp: 30 tablet, Rfl: 1   prochlorperazine (COMPAZINE) 10 MG tablet, Take 1 tablet (10 mg total) by mouth every 6 (six) hours as needed for nausea or vomiting. (Patient not taking: Reported on 04/02/2023), Disp: 30 tablet, Rfl: 1   valACYclovir (VALTREX) 500 MG tablet, Take 500 mg by mouth daily., Disp: , Rfl:   Allergies:  Allergies  Allergen Reactions   Morphine Itching   Sertraline Other (See Comments) and Diarrhea    Diarrhea   Hydroxyzine     Did not like how it made her feel   Morphine And Codeine Itching   Peanut (Diagnostic) Other (See Comments)    Occasionally causes "eye swelling"   Shellfish Allergy Other (See Comments)    Occasionally causes "eye swelling"    Past Medical History, Surgical history, Social history, and Family History were reviewed and updated.  Review of Systems: Review of Systems  Constitutional: Negative.   HENT:  Negative.    Eyes: Negative.   Respiratory: Negative.    Cardiovascular: Negative.   Gastrointestinal: Negative.   Endocrine: Negative.   Genitourinary: Negative.    Musculoskeletal: Negative.   Skin: Negative.   Neurological: Negative.   Hematological: Negative.   Psychiatric/Behavioral: Negative.  Physical Exam:  Temperature 98.4.  Pulse 84.  Blood pressure 119/81.  Weight is 206 pounds.  Wt Readings from Last 3 Encounters:  04/02/23 209 lb 6.4 oz (95 kg)  04/01/23 207 lb (93.9 kg)  03/10/23 207 lb (93.9 kg)    Physical Exam Vitals reviewed.  HENT:     Head: Normocephalic and atraumatic.  Eyes:     Pupils: Pupils are equal, round, and reactive to light.  Cardiovascular:     Rate and Rhythm: Normal rate and regular rhythm.     Heart sounds: Normal heart sounds.  Pulmonary:     Effort: Pulmonary effort is normal.      Breath sounds: Normal breath sounds.  Abdominal:     General: Bowel sounds are normal.     Palpations: Abdomen is soft.  Musculoskeletal:        General: No tenderness or deformity. Normal range of motion.     Cervical back: Normal range of motion.  Lymphadenopathy:     Cervical: No cervical adenopathy.  Skin:    General: Skin is warm and dry.     Findings: No erythema or rash.  Neurological:     Mental Status: She is alert and oriented to person, place, and time.  Psychiatric:        Behavior: Behavior normal.        Thought Content: Thought content normal.        Judgment: Judgment normal.      Lab Results  Component Value Date   WBC 7.8 04/30/2023   HGB 12.5 04/30/2023   HCT 34.6 (L) 04/30/2023   MCV 91.8 04/30/2023   PLT 73 (L) 04/30/2023     Chemistry      Component Value Date/Time   NA 136 04/02/2023 0815   K 3.5 04/02/2023 0815   CL 100 04/02/2023 0815   CO2 27 04/02/2023 0815   BUN 12 04/02/2023 0815   CREATININE 0.91 04/02/2023 0815      Component Value Date/Time   CALCIUM 9.2 04/02/2023 0815   ALKPHOS 80 04/02/2023 0815   AST 18 04/02/2023 0815   ALT 11 04/02/2023 0815   BILITOT 0.6 04/02/2023 0815       Impression and Plan: [name] is a very nice 57 year old white female.  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  She has an intermediate prognostic scale.  Her blood count is little bit on the lower side.  I will lower the dose of the Bendamustine.  I still think we go ahead with treatment.  After this treatment, we will go ahead and plan for a PET scan.  We will do the PET scan about 3 weeks.  I have to believe that the PET scan will show that she is in remission.  We will plan to get her back in another month.    Josph Macho, MD 12/5/20248:21 AM

## 2023-05-01 ENCOUNTER — Other Ambulatory Visit: Payer: Self-pay

## 2023-05-01 ENCOUNTER — Inpatient Hospital Stay: Payer: BC Managed Care – PPO

## 2023-05-01 VITALS — BP 130/87 | HR 92 | Temp 97.6°F | Resp 18

## 2023-05-01 DIAGNOSIS — Z5112 Encounter for antineoplastic immunotherapy: Secondary | ICD-10-CM | POA: Diagnosis not present

## 2023-05-01 DIAGNOSIS — C8218 Follicular lymphoma grade II, lymph nodes of multiple sites: Secondary | ICD-10-CM

## 2023-05-01 MED ORDER — SODIUM CHLORIDE 0.9 % IV SOLN
81.0000 mg/m2 | Freq: Once | INTRAVENOUS | Status: AC
  Administered 2023-05-01: 165 mg via INTRAVENOUS
  Filled 2023-05-01: qty 6.6

## 2023-05-01 MED ORDER — SODIUM CHLORIDE 0.9% FLUSH
10.0000 mL | INTRAVENOUS | Status: DC | PRN
  Administered 2023-05-01: 10 mL

## 2023-05-01 MED ORDER — SODIUM CHLORIDE 0.9 % IV SOLN
INTRAVENOUS | Status: DC
Start: 2023-05-01 — End: 2023-05-01

## 2023-05-01 MED ORDER — DEXAMETHASONE SODIUM PHOSPHATE 10 MG/ML IJ SOLN
10.0000 mg | Freq: Once | INTRAMUSCULAR | Status: AC
  Administered 2023-05-01: 10 mg via INTRAVENOUS
  Filled 2023-05-01: qty 1

## 2023-05-01 MED ORDER — HEPARIN SOD (PORK) LOCK FLUSH 100 UNIT/ML IV SOLN
500.0000 [IU] | Freq: Once | INTRAVENOUS | Status: AC | PRN
  Administered 2023-05-01: 500 [IU]

## 2023-05-01 NOTE — Patient Instructions (Signed)
[facility] A [facility] OF [facility]  Discharge Instructions: Thank you for choosing [facility] to provide your oncology and hematology care.   If you have a lab appointment with the Cancer Center, please go directly to the Cancer Center and check in at the registration area.  Wear comfortable clothing and clothing appropriate for easy access to any Portacath or PICC line.   We strive to give you quality time with your provider. You may need to reschedule your appointment if you arrive late (15 or more minutes).  Arriving late affects you and other patients whose appointments are after yours.  Also, if you miss three or more appointments without notifying the office, you may be dismissed from the clinic at the provider's discretion.      For prescription refill requests, have your pharmacy contact our office and allow 72 hours for refills to be completed.    Today you received the following chemotherapy and/or immunotherapy agents BENADMUSTINE       To help prevent nausea and vomiting after your treatment, we encourage you to take your nausea medication as directed.  BELOW ARE SYMPTOMS THAT SHOULD BE REPORTED IMMEDIATELY: *FEVER GREATER THAN 100.4 F (38 C) OR HIGHER *CHILLS OR SWEATING *NAUSEA AND VOMITING THAT IS NOT CONTROLLED WITH YOUR NAUSEA MEDICATION *UNUSUAL SHORTNESS OF BREATH *UNUSUAL BRUISING OR BLEEDING *URINARY PROBLEMS (pain or burning when urinating, or frequent urination) *BOWEL PROBLEMS (unusual diarrhea, constipation, pain near the anus) TENDERNESS IN MOUTH AND THROAT WITH OR WITHOUT PRESENCE OF ULCERS (sore throat, sores in mouth, or a toothache) UNUSUAL RASH, SWELLING OR PAIN  UNUSUAL VAGINAL DISCHARGE OR ITCHING   Items with * indicate a potential emergency and should be followed up as soon as possible or go to the Emergency Department if any problems should occur.  Please show the CHEMOTHERAPY ALERT CARD or  IMMUNOTHERAPY ALERT CARD at check-in to the Emergency Department and triage nurse. Should you have questions after your visit or need to cancel or reschedule your appointment, please contact [facility] - A DEPT OF [facility]  [phone] and follow the prompts.  Office hours are 8:00 a.m. to 4:30 p.m. Monday - Friday. Please note that voicemails left after 4:00 p.m. may not be returned until the following business day.  We are closed weekends and major holidays. You have access to a nurse at all times for urgent questions. Please call the main number to the clinic [phone] and follow the prompts.  For any non-urgent questions, you may also contact your provider using MyChart. We now offer e-Visits for anyone 40 and older to request care online for non-urgent symptoms. For details visit [email or web address].   Also download the MyChart app! Go to the app store, search "MyChart", open the app, select [place], and log in with your MyChart username and password.

## 2023-05-18 ENCOUNTER — Encounter (HOSPITAL_COMMUNITY)
Admission: RE | Admit: 2023-05-18 | Discharge: 2023-05-18 | Disposition: A | Discharge status: Home / Self Care | Payer: BC Managed Care – PPO | Source: Ambulatory Visit | Attending: Hematology & Oncology | Admitting: Hematology & Oncology

## 2023-05-18 ENCOUNTER — Telehealth: Payer: Self-pay

## 2023-05-18 DIAGNOSIS — C8218 Follicular lymphoma grade II, lymph nodes of multiple sites: Secondary | ICD-10-CM | POA: Diagnosis present

## 2023-05-18 LAB — GLUCOSE, CAPILLARY: Glucose-Capillary: 137 mg/dL — ABNORMAL HIGH (ref 70–99)

## 2023-05-18 MED ORDER — FLUDEOXYGLUCOSE F - 18 (FDG) INJECTION
10.0000 | Freq: Once | INTRAVENOUS | Status: AC | PRN
  Administered 2023-05-18: 10.32 via INTRAVENOUS

## 2023-05-18 NOTE — Telephone Encounter (Signed)
Advised via MyChart.

## 2023-05-18 NOTE — Telephone Encounter (Signed)
-----   Message from Josph Macho sent at 05/18/2023 11:30 AM EST ----- Please call and let her know that the lymphoma is in remission now.  I am not surprised.  God is great.  Merry Christmas!!!!  Cindee Lame

## 2023-05-19 ENCOUNTER — Encounter: Payer: Self-pay | Admitting: *Deleted

## 2023-05-19 NOTE — Progress Notes (Signed)
Reviewed PET which shows CR.   Oncology Nurse Navigator Documentation     05/19/2023   10:15 AM  Oncology Nurse Navigator Flowsheets  Navigator Follow Up Date: 05/28/2023  Navigator Follow Up Reason: Follow-up Appointment;Chemotherapy  Navigator Location [facility]  Navigator Encounter Type Scan Review  Patient Visit Type MedOnc  Treatment Phase Active Tx  Barriers/Navigation Needs Coordination of Care;Education  Interventions None Required  Acuity Level 2-Minimal Needs (1-2 Barriers Identified)  Support Groups/Services Friends and Family  Time Spent with Patient 15

## 2023-05-28 ENCOUNTER — Inpatient Hospital Stay: Payer: BC Managed Care – PPO

## 2023-05-28 ENCOUNTER — Encounter: Payer: Self-pay | Admitting: Hematology & Oncology

## 2023-05-28 ENCOUNTER — Encounter: Payer: Self-pay | Admitting: *Deleted

## 2023-05-28 ENCOUNTER — Inpatient Hospital Stay: Payer: BC Managed Care – PPO | Attending: Hematology & Oncology

## 2023-05-28 ENCOUNTER — Inpatient Hospital Stay: Payer: BC Managed Care – PPO | Admitting: Hematology & Oncology

## 2023-05-28 VITALS — BP 116/73 | HR 88 | Temp 97.7°F | Resp 18

## 2023-05-28 VITALS — BP 129/81 | HR 95 | Temp 98.0°F | Resp 18 | Ht 63.5 in | Wt 209.1 lb

## 2023-05-28 DIAGNOSIS — C8218 Follicular lymphoma grade II, lymph nodes of multiple sites: Secondary | ICD-10-CM | POA: Diagnosis not present

## 2023-05-28 DIAGNOSIS — C8299 Follicular lymphoma, unspecified, extranodal and solid organ sites: Secondary | ICD-10-CM

## 2023-05-28 DIAGNOSIS — Z5112 Encounter for antineoplastic immunotherapy: Secondary | ICD-10-CM | POA: Insufficient documentation

## 2023-05-28 DIAGNOSIS — C8290 Follicular lymphoma, unspecified, unspecified site: Secondary | ICD-10-CM | POA: Insufficient documentation

## 2023-05-28 DIAGNOSIS — Z5111 Encounter for antineoplastic chemotherapy: Secondary | ICD-10-CM | POA: Diagnosis present

## 2023-05-28 LAB — CBC WITH DIFFERENTIAL (CANCER CENTER ONLY)
Abs Immature Granulocytes: 0.04 10*3/uL (ref 0.00–0.07)
Basophils Absolute: 0 10*3/uL (ref 0.0–0.1)
Basophils Relative: 1 %
Eosinophils Absolute: 0.1 10*3/uL (ref 0.0–0.5)
Eosinophils Relative: 2 %
HCT: 30.8 % — ABNORMAL LOW (ref 36.0–46.0)
Hemoglobin: 10.9 g/dL — ABNORMAL LOW (ref 12.0–15.0)
Immature Granulocytes: 1 %
Lymphocytes Relative: 26 %
Lymphs Abs: 1.3 10*3/uL (ref 0.7–4.0)
MCH: 33.5 pg (ref 26.0–34.0)
MCHC: 35.4 g/dL (ref 30.0–36.0)
MCV: 94.8 fL (ref 80.0–100.0)
Monocytes Absolute: 0.6 10*3/uL (ref 0.1–1.0)
Monocytes Relative: 12 %
Neutro Abs: 2.8 10*3/uL (ref 1.7–7.7)
Neutrophils Relative %: 58 %
Platelet Count: 103 10*3/uL — ABNORMAL LOW (ref 150–400)
RBC: 3.25 MIL/uL — ABNORMAL LOW (ref 3.87–5.11)
RDW: 14.6 % (ref 11.5–15.5)
WBC Count: 4.8 10*3/uL (ref 4.0–10.5)
nRBC: 0 % (ref 0.0–0.2)

## 2023-05-28 LAB — CMP (CANCER CENTER ONLY)
ALT: 17 U/L (ref 0–44)
AST: 20 U/L (ref 15–41)
Albumin: 4.2 g/dL (ref 3.5–5.0)
Alkaline Phosphatase: 82 U/L (ref 38–126)
Anion gap: 10 (ref 5–15)
BUN: 14 mg/dL (ref 6–20)
CO2: 23 mmol/L (ref 22–32)
Calcium: 8.2 mg/dL — ABNORMAL LOW (ref 8.9–10.3)
Chloride: 102 mmol/L (ref 98–111)
Creatinine: 0.94 mg/dL (ref 0.44–1.00)
GFR, Estimated: >60 mL/min (ref >60)
Glucose, Bld: 115 mg/dL — ABNORMAL HIGH (ref 70–99)
Potassium: 3.8 mmol/L (ref 3.5–5.1)
Sodium: 135 mmol/L (ref 135–145)
Total Bilirubin: 0.7 mg/dL (ref 0.0–1.2)
Total Protein: 6.5 g/dL (ref 6.5–8.1)

## 2023-05-28 LAB — LACTATE DEHYDROGENASE: LDH: 223 U/L — ABNORMAL HIGH (ref 98–192)

## 2023-05-28 MED ORDER — PALONOSETRON HCL INJECTION 0.25 MG/5ML
0.2500 mg | Freq: Once | INTRAVENOUS | Status: AC
  Administered 2023-05-28: 0.25 mg via INTRAVENOUS
  Filled 2023-05-28: qty 5

## 2023-05-28 MED ORDER — HEPARIN SOD (PORK) LOCK FLUSH 100 UNIT/ML IV SOLN
500.0000 [IU] | Freq: Once | INTRAVENOUS | Status: AC | PRN
  Administered 2023-05-28: 500 [IU]

## 2023-05-28 MED ORDER — SODIUM CHLORIDE 0.9 % IV SOLN
81.0000 mg/m2 | Freq: Once | INTRAVENOUS | Status: AC
  Administered 2023-05-28: 165 mg via INTRAVENOUS
  Filled 2023-05-28: qty 6.6

## 2023-05-28 MED ORDER — DIPHENHYDRAMINE HCL 25 MG PO CAPS
50.0000 mg | ORAL_CAPSULE | Freq: Once | ORAL | Status: AC
  Administered 2023-05-28: 50 mg via ORAL
  Filled 2023-05-28: qty 2

## 2023-05-28 MED ORDER — SODIUM CHLORIDE 0.9% FLUSH
10.0000 mL | INTRAVENOUS | Status: DC | PRN
  Administered 2023-05-28: 10 mL

## 2023-05-28 MED ORDER — SODIUM CHLORIDE 0.9 % IV SOLN: INTRAVENOUS | Status: DC

## 2023-05-28 MED ORDER — ACETAMINOPHEN 325 MG PO TABS
650.0000 mg | ORAL_TABLET | Freq: Once | ORAL | Status: AC
Start: 2023-05-28 — End: 2023-05-28
  Administered 2023-05-28: 650 mg via ORAL
  Filled 2023-05-28: qty 2

## 2023-05-28 MED ORDER — COLD PACK MISC ONCOLOGY
1.0000 | Freq: Once | Status: DC | PRN
Start: 2023-05-28 — End: 2023-05-28

## 2023-05-28 MED ORDER — SODIUM CHLORIDE 0.9 % IV SOLN
375.0000 mg/m2 | Freq: Once | INTRAVENOUS | Status: AC
  Administered 2023-05-28: 800 mg via INTRAVENOUS
  Filled 2023-05-28: qty 50

## 2023-05-28 MED ORDER — DEXAMETHASONE SODIUM PHOSPHATE 10 MG/ML IJ SOLN
10.0000 mg | Freq: Once | INTRAMUSCULAR | Status: AC
  Administered 2023-05-28: 10 mg via INTRAVENOUS
  Filled 2023-05-28: qty 1

## 2023-05-28 NOTE — Progress Notes (Signed)
 Patient will proceed with third cycle. Her recent PET showed excellent response. Plan for six cycles with possible maintenance.   Oncology Nurse Navigator Documentation     05/28/2023   10:00 AM  Oncology Nurse Navigator Flowsheets  Navigator Follow Up Date: 06/25/2023  Navigator Follow Up Reason: Follow-up Appointment;Chemotherapy  Navigator Radiographer, Therapeutic Encounter Type Treatment  Patient Visit Type MedOnc  Treatment Phase Active Tx  Barriers/Navigation Needs Coordination of Care;Education  Interventions Psycho-Social Support  Acuity Level 2-Minimal Needs (1-2 Barriers Identified)  Support Groups/Services Friends and Family  Time Spent with Patient 15

## 2023-05-28 NOTE — Progress Notes (Signed)
 Hematology and Oncology Follow Up Visit  Bonnie Russell [id number] 07-06-1965 57 y.o. 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  As expected, she has responded very nicely to treatment.  She had a PET scan that was done on 05/18/2023.  PET scan showed resolution of her nodal activity.  She feels well.  She is trying to lose a little bit of weight.  I am sure that she will.  She has had no problems with bowels or bladder.  She has had no cough or shortness of breath.  There is been no fever.  She has had no bleeding.  She has had no rashes.  There is been no leg swelling.  She had a wonderful Christmas and New Years.  Overall, I would say that her performance status is probably ECOG 0.   Medications:  Current Outpatient Medications:    ALPRAZolam  (XANAX ) 0.5 MG tablet, Take 1 tablet (0.5 mg total) by mouth 2 (two) times daily as needed for anxiety, Disp: 30 tablet, Rfl: 2   amLODipine  (NORVASC ) 5 MG tablet, Take 1 tablet (5 mg total) by mouth daily., Disp: 30 tablet, Rfl: 1   aspirin-acetaminophen -caffeine (EXCEDRIN MIGRAINE) 250-250-65 MG tablet, Take by mouth every 6 (six) hours as needed for headache., Disp: , Rfl:    dexamethasone  (DECADRON ) 4 MG tablet, Take 2 tablets (8 mg total) by mouth daily. Start the day after bendamustine  chemotherapy for 2 days. Take with food., Disp: 30 tablet, Rfl: 1   diphenhydrAMINE  HCl, Sleep, (ZZZQUIL) 50 MG/30ML LIQD, Take 1 Capful by mouth at bedtime as needed., Disp: , Rfl:    DULoxetine  (CYMBALTA ) 60 MG capsule, Take 1 capsule (60 mg total) by mouth daily., Disp: 90 capsule, Rfl: 2   metoprolol  succinate (TOPROL -XL) 50 MG 24 hr tablet, Take 1 tablet by mouth daily., Disp: , Rfl:    Multiple Vitamin (MULTIVITAMIN WITH MINERALS) TABS tablet, Take 1 tablet by mouth  daily., Disp: 30 tablet, Rfl: 0   ondansetron  (ZOFRAN ) 8 MG tablet, Take 1 tablet (8 mg total) by mouth every 8 (eight) hours as needed for nausea or vomiting. Start on the third day after chemotherapy., Disp: 30 tablet, Rfl: 1   prochlorperazine  (COMPAZINE ) 10 MG tablet, Take 1 tablet (10 mg total) by mouth every 6 (six) hours as needed for nausea or vomiting., Disp: 30 tablet, Rfl: 1   valACYclovir  (VALTREX ) 500 MG tablet, Take 500 mg by mouth daily., Disp: , Rfl:   Allergies:  Allergies  Allergen Reactions   Morphine Itching   Sertraline Other (See Comments) and Diarrhea    Diarrhea   Hydroxyzine     Did not like how it made her feel   Morphine And Codeine Itching   Peanut (Diagnostic) Other (See Comments)    Occasionally causes eye swelling   Shellfish Allergy Other (See Comments)    Occasionally causes eye swelling    Past Medical History, Surgical history, Social history, and Family History were reviewed and updated.  Review of Systems: Review of Systems  Constitutional: Negative.   HENT:  Negative.    Eyes: Negative.   Respiratory: Negative.    Cardiovascular: Negative.   Gastrointestinal: Negative.   Endocrine: Negative.   Genitourinary: Negative.    Musculoskeletal: Negative.   Skin: Negative.   Neurological: Negative.   Hematological: Negative.   Psychiatric/Behavioral:  Negative.      Physical Exam:  Temperature 98.  Pulse 95.  Blood pressure 129/81.  Weight is 209 pounds.    Wt Readings from Last 3 Encounters:  05/28/23 209 lb 1.9 oz (94.9 kg)  04/30/23 206 lb (93.4 kg)  04/02/23 209 lb 6.4 oz (95 kg)    Physical Exam Vitals reviewed.  HENT:     Head: Normocephalic and atraumatic.  Eyes:     Pupils: Pupils are equal, round, and reactive to light.  Cardiovascular:     Rate and Rhythm: Normal rate and regular rhythm.     Heart sounds: Normal heart sounds.  Pulmonary:     Effort: Pulmonary effort is normal.     Breath sounds: Normal breath  sounds.  Abdominal:     General: Bowel sounds are normal.     Palpations: Abdomen is soft.  Musculoskeletal:        General: No tenderness or deformity. Normal range of motion.     Cervical back: Normal range of motion.  Lymphadenopathy:     Cervical: No cervical adenopathy.  Skin:    General: Skin is warm and dry.     Findings: No erythema or rash.  Neurological:     Mental Status: She is alert and oriented to person, place, and time.  Psychiatric:        Behavior: Behavior normal.        Thought Content: Thought content normal.        Judgment: Judgment normal.      Lab Results  Component Value Date   WBC 4.8 05/28/2023   HGB 10.9 (L) 05/28/2023   HCT 30.8 (L) 05/28/2023   MCV 94.8 05/28/2023   PLT 103 (L) 05/28/2023     Chemistry      Component Value Date/Time   NA 135 04/30/2023 0755   K 3.8 04/30/2023 0755   CL 99 04/30/2023 0755   CO2 25 04/30/2023 0755   BUN 13 04/30/2023 0755   CREATININE 1.14 (H) 04/30/2023 0755      Component Value Date/Time   CALCIUM  8.8 (L) 04/30/2023 0755   ALKPHOS 81 04/30/2023 0755   AST 21 04/30/2023 0755   ALT 20 04/30/2023 0755   BILITOT 0.7 04/30/2023 0755       Impression and Plan: Bonnie Russell is a very nice 58 year old white female.  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  She has an intermediate prognostic scale.  Again, she has responded so well.  I am not surprised.  We will proceed with treatment.  Again, I think that a total 6 cycles of treatment is probably what we will need.  I am not sure that she will need any maintenance treatment afterwards.  Will see her back in 1 month.    Bonnie JONELLE Crease, MD 1/2/20258:33 AM

## 2023-05-28 NOTE — Patient Instructions (Signed)

## 2023-05-28 NOTE — Patient Instructions (Signed)
 [facility]  Discharge Instructions: Thank you for choosing [facility] to provide your oncology and hematology care.   If you have a lab appointment with the Cancer Center, please go directly to the Cancer Center and check in at the registration area.  Wear comfortable clothing and clothing appropriate for easy access to any Portacath or PICC line.   We strive to give you quality time with your provider. You may need to reschedule your appointment if you arrive late (15 or more minutes).  Arriving late affects you and other patients whose appointments are after yours.  Also, if you miss three or more appointments without notifying the office, you may be dismissed from the clinic at the provider's discretion.      For prescription refill requests, have your pharmacy contact our office and allow 72 hours for refills to be completed.    Today you received the following chemotherapy and/or immunotherapy agents Rituxan, Bendeka      To help prevent nausea and vomiting after your treatment, we encourage you to take your nausea medication as directed.  BELOW ARE SYMPTOMS THAT SHOULD BE REPORTED IMMEDIATELY: *FEVER GREATER THAN 100.4 F (38 C) OR HIGHER *CHILLS OR SWEATING *NAUSEA AND VOMITING THAT IS NOT CONTROLLED WITH YOUR NAUSEA MEDICATION *UNUSUAL SHORTNESS OF BREATH *UNUSUAL BRUISING OR BLEEDING *URINARY PROBLEMS (pain or burning when urinating, or frequent urination) *BOWEL PROBLEMS (unusual diarrhea, constipation, pain near the anus) TENDERNESS IN MOUTH AND THROAT WITH OR WITHOUT PRESENCE OF ULCERS (sore throat, sores in mouth, or a toothache) UNUSUAL RASH, SWELLING OR PAIN  UNUSUAL VAGINAL DISCHARGE OR ITCHING   Items with * indicate a potential emergency and should be followed up as soon as possible or go to the Emergency Department if any problems should occur.  Please show the CHEMOTHERAPY ALERT CARD or  IMMUNOTHERAPY ALERT CARD at check-in to the Emergency Department and triage nurse. Should you have questions after your visit or need to cancel or reschedule your appointment, please contact [facility] - A DEPT OF [facility]  [phone] and follow the prompts.  Office hours are 8:00 a.m. to 4:30 p.m. Monday - Friday. Please note that voicemails left after 4:00 p.m. may not be returned until the following business day.  We are closed weekends and major holidays. You have access to a nurse at all times for urgent questions. Please call the main number to the clinic [phone] and follow the prompts.  For any non-urgent questions, you may also contact your provider using MyChart. We now offer e-Visits for anyone 30 and older to request care online for non-urgent symptoms. For details visit [email or web address].   Also download the MyChart app! Go to the app store, search "MyChart", open the app, select [place], and log in with your MyChart username and password.

## 2023-05-29 ENCOUNTER — Inpatient Hospital Stay: Payer: BC Managed Care – PPO

## 2023-05-29 VITALS — BP 122/78 | HR 94 | Temp 98.3°F | Resp 18

## 2023-05-29 DIAGNOSIS — Z5112 Encounter for antineoplastic immunotherapy: Secondary | ICD-10-CM | POA: Diagnosis not present

## 2023-05-29 DIAGNOSIS — C8218 Follicular lymphoma grade II, lymph nodes of multiple sites: Secondary | ICD-10-CM

## 2023-05-29 LAB — KAPPA/LAMBDA LIGHT CHAINS
Kappa free light chain: 11.7 mg/L (ref 3.3–19.4)
Kappa, lambda light chain ratio: 1.16 (ref 0.26–1.65)
Lambda free light chains: 10.1 mg/L (ref 5.7–26.3)

## 2023-05-29 LAB — IGG, IGA, IGM
IgA: 44 mg/dL — ABNORMAL LOW (ref 87–352)
IgG (Immunoglobin G), Serum: 725 mg/dL (ref 586–1602)
IgM (Immunoglobulin M), Srm: 132 mg/dL (ref 26–217)

## 2023-05-29 MED ORDER — SODIUM CHLORIDE 0.9 % IV SOLN
81.0000 mg/m2 | Freq: Once | INTRAVENOUS | Status: AC
  Administered 2023-05-29: 165 mg via INTRAVENOUS
  Filled 2023-05-29: qty 6.6

## 2023-05-29 MED ORDER — DEXAMETHASONE SODIUM PHOSPHATE 10 MG/ML IJ SOLN
10.0000 mg | Freq: Once | INTRAMUSCULAR | Status: AC
  Administered 2023-05-29: 10 mg via INTRAVENOUS
  Filled 2023-05-29: qty 1

## 2023-05-29 MED ORDER — SODIUM CHLORIDE 0.9% FLUSH
10.0000 mL | INTRAVENOUS | Status: DC | PRN
  Administered 2023-05-29: 10 mL

## 2023-05-29 MED ORDER — SODIUM CHLORIDE 0.9 % IV SOLN: INTRAVENOUS | Status: DC

## 2023-05-29 MED ORDER — HEPARIN SOD (PORK) LOCK FLUSH 100 UNIT/ML IV SOLN
500.0000 [IU] | Freq: Once | INTRAVENOUS | Status: AC | PRN
  Administered 2023-05-29: 500 [IU]

## 2023-05-29 NOTE — Progress Notes (Signed)
 Elevated HR reviewed with MD, ok to treat.

## 2023-05-29 NOTE — Patient Instructions (Signed)
Bendamustine Injection What is this medication? BENDAMUSTINE (BEN da MUS teen) treats leukemia and lymphoma. It works by slowing down the growth of cancer cells. This medicine may be used for other purposes; ask your health care provider or pharmacist if you have questions. COMMON BRAND NAME(S): Marilynne Halsted, VIVIMUSTA What should I tell my care team before I take this medication? They need to know if you have any of these conditions: Infection, especially a viral infection, such as chickenpox, cold sores, herpes Kidney disease Liver disease An unusual or allergic reaction to bendamustine, mannitol, other medications, foods, dyes, or preservatives Pregnant or trying to get pregnant Breast-feeding How should I use this medication? This medication is injected into a vein. It is given by your care team in a hospital or clinic setting. Talk to your care team about the use of this medication in children. Special care may be needed. Overdosage: If you think you have taken too much of this medicine contact a poison control center or emergency room at once. NOTE: This medicine is only for you. Do not share this medicine with others. What if I miss a dose? Keep appointments for follow-up doses. It is important not to miss your dose. Call your care team if you are unable to keep an appointment. What may interact with this medication? Do not take this medication with any of the following: Clozapine This medication may also interact with the following: Atazanavir Cimetidine Ciprofloxacin Enoxacin Fluvoxamine Medications for seizures, such as carbamazepine, phenobarbital Mexiletine Rifampin Tacrine Thiabendazole Zileuton This list may not describe all possible interactions. Give your health care provider a list of all the medicines, herbs, non-prescription drugs, or dietary supplements you use. Also tell them if you smoke, drink alcohol, or use illegal drugs. Some items may  interact with your medicine. What should I watch for while using this medication? Visit your care team for regular checks on your progress. This medication may make you feel generally unwell. This is not uncommon, as chemotherapy can affect healthy cells as well as cancer cells. Report any side effects. Continue your course of treatment even though you feel ill unless your care team tells you to stop. You may need blood work while taking this medication. This medication may increase your risk of getting an infection. Call your care team for advice if you get a fever, chills, sore throat, or other symptoms of a cold or flu. Do not treat yourself. Try to avoid being around people who are sick. This medication may cause serious skin reactions. They can happen weeks to months after starting the medication. Contact your care team right away if you notice fevers or flu-like symptoms with a rash. The rash may be red or purple and then turn into blisters or peeling of the skin. You may also notice a red rash with swelling of the face, lips, or lymph nodes in your neck or under your arms. In some patients, this medication may cause a serious brain infection that may cause death. If you have any problems seeing, thinking, speaking, walking, or standing, tell your care team right away. If you cannot reach your care team, urgently seek other source of medical care. This medication may increase your risk to bruise or bleed. Call your care team if you notice any unusual bleeding. Talk to your care team about your risk of cancer. You may be more at risk for certain types of cancer if you take this medication. Talk to your care team about your  risk of skin cancer. You may be more at risk for skin cancer if you take this medication. Talk to your care team if you or your partner wish to become pregnant or think either of you might be pregnant. This medication can cause serious birth defects if taken during pregnancy or for  up to 6 months after the last dose. A negative pregnancy test is required before starting this medication. A reliable form of contraception is recommended while taking this medication and for 6 months after the last dose. Talk to your care team about reliable forms of contraception. Wear a condom while taking this medication and for at least 3 months after the last dose. Do not breast-feed while taking this medication or for at least 1 week after the last dose. This medication may cause infertility. Talk to your care team if you are concerned about your fertility. What side effects may I notice from receiving this medication? Side effects that you should report to your care team as soon as possible: Allergic reactions--skin rash, itching, hives, swelling of the face, lips, tongue, or throat Infection--fever, chills, cough, sore throat, wounds that don't heal, pain or trouble when passing urine, general feeling of discomfort or being unwell Infusion reactions--chest pain, shortness of breath or trouble breathing, feeling faint or lightheaded Liver injury--right upper belly pain, loss of appetite, nausea, light-colored stool, dark yellow or brown urine, yellowing skin or eyes, unusual weakness or fatigue Low red blood cell level--unusual weakness or fatigue, dizziness, headache, trouble breathing Painful swelling, warmth, or redness of the skin, blisters or sores at the infusion site Rash, fever, and swollen lymph nodes Redness, blistering, peeling, or loosening of the skin, including inside the mouth Tumor lysis syndrome (TLS)--nausea, vomiting, diarrhea, decrease in the amount of urine, dark urine, unusual weakness or fatigue, confusion, muscle pain or cramps, fast or irregular heartbeat, joint pain Unusual bruising or bleeding Side effects that usually do not require medical attention (report to your care team if they continue or are bothersome): Diarrhea Fatigue Headache Loss of  appetite Nausea Vomiting This list may not describe all possible side effects. Call your doctor for medical advice about side effects. You may report side effects to FDA at 1-800-FDA-1088. Where should I keep my medication? This medication is given in a hospital or clinic. It will not be stored at home. NOTE: This sheet is a summary. It may not cover all possible information. If you have questions about this medicine, talk to your doctor, pharmacist, or health care provider.  2024 Elsevier/Gold Standard (2021-09-03 00:00:00)

## 2023-06-01 LAB — PROTEIN ELECTROPHORESIS, SERUM, WITH REFLEX
A/G Ratio: 1.4 (ref 0.7–1.7)
Albumin ELP: 3.6 g/dL (ref 2.9–4.4)
Alpha-1-Globulin: 0.2 g/dL (ref 0.0–0.4)
Alpha-2-Globulin: 0.7 g/dL (ref 0.4–1.0)
Beta Globulin: 0.9 g/dL (ref 0.7–1.3)
Gamma Globulin: 0.7 g/dL (ref 0.4–1.8)
Globulin, Total: 2.6 g/dL (ref 2.2–3.9)
Total Protein ELP: 6.2 g/dL (ref 6.0–8.5)

## 2023-06-10 IMAGING — MR MR HEAD W/O CM
9 of 13 series · 20 of 48 positions shown · non-contrast
Comparison: No prior MRI, correlation is made with CT head
08/29/2021

CLINICAL DATA: Seizure, new onset, no history of trauma

EXAM:
MRI HEAD WITHOUT CONTRAST
TECHNIQUE: Multiplanar, multiecho pulse sequences of the brain and surrounding
structures were obtained without intravenous contrast.

[Series 2: DWI · axial · 3.0mm · 0.94mm/px · z∈[-79,+67]mm · 4 of 100 slices shown (1 of 2)]
[im 1/100]
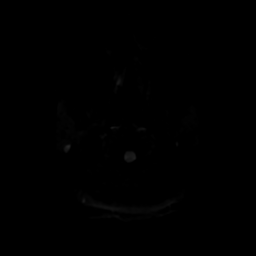
[im 34/100]
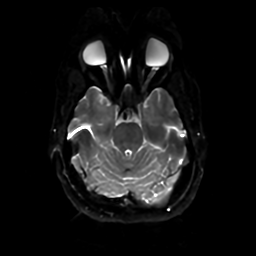
[im 67/100]
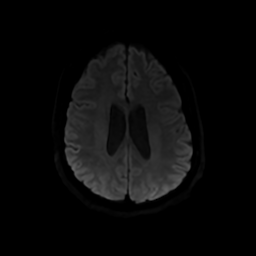
[im 100/100]
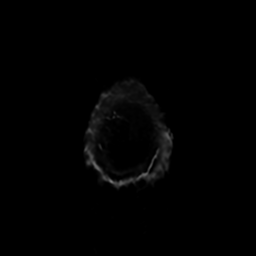

[Series 3: DWI · coronal · 4.0mm · 0.94mm/px · 4 of 74 slices shown (2 of 2)]
[im 1/74]
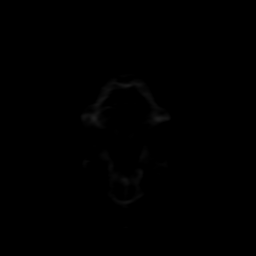
[im 25/74]
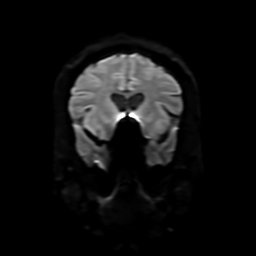
[im 49/74]
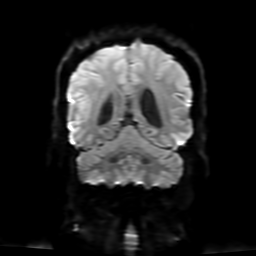
[im 74/74]
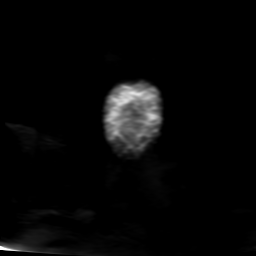

[Series 4: FLAIR · sagittal · 5.0mm · 0.23mm/px · 1 of 23 slices shown (1 of 3)]
[im 1/23]
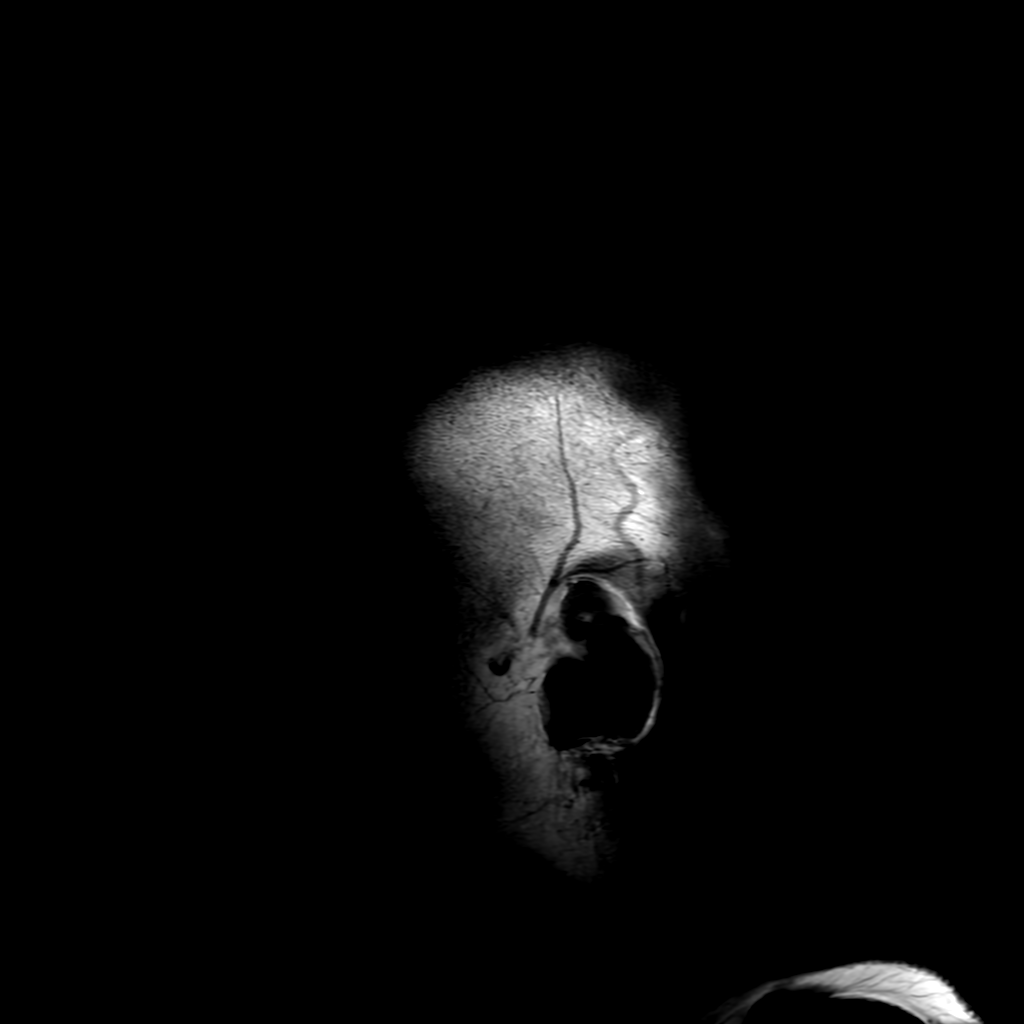

[Series 5: T2 · axial · 5.0mm · 0.23mm/px · 1 of 26 slices shown (1 of 2)]
[im 1/26]
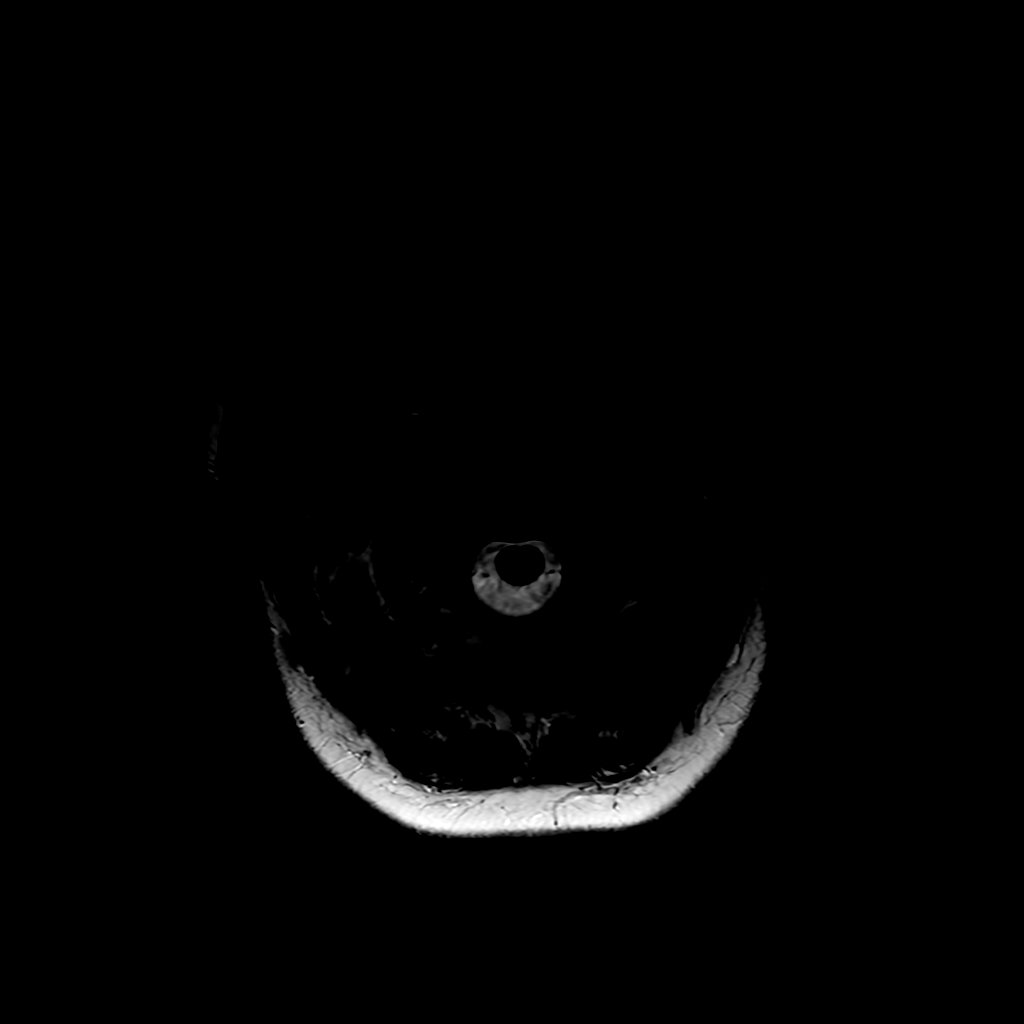

[Series 6: FLAIR · axial · 4.0mm · 0.45mm/px · z∈[-80,+68]mm · 2 of 35 slices shown (2 of 3)]
[im 1/35]
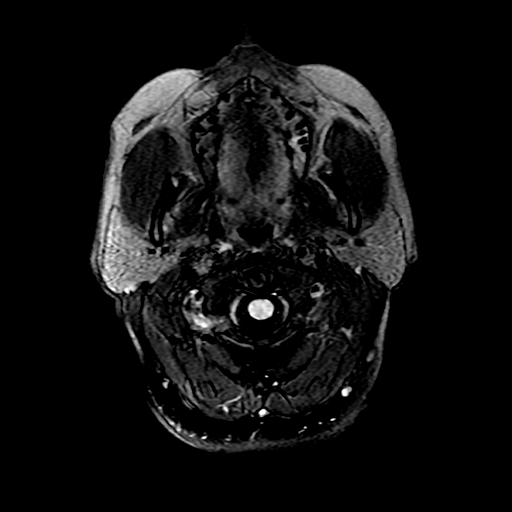
[im 35/35]
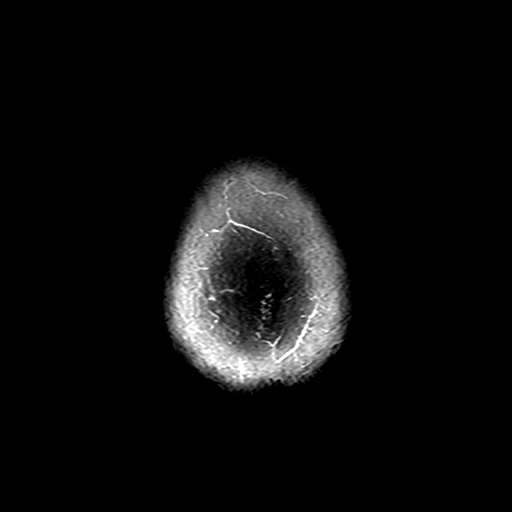

[Series 9: T2 · coronal · 3.0mm · 0.35mm/px · 2 of 34 slices shown (2 of 2)]
[im 1/34]
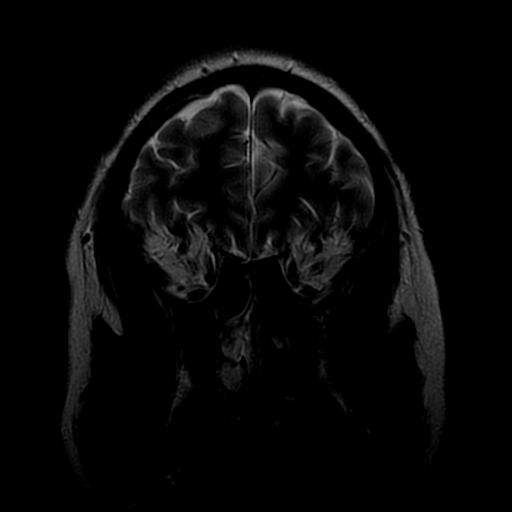
[im 34/34]
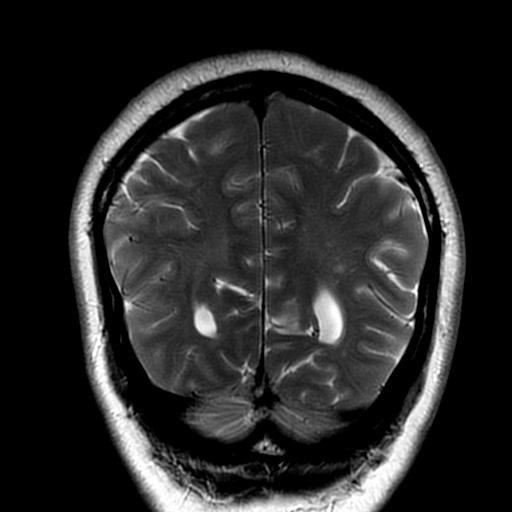

[Series 10: FLAIR · coronal · 3.0mm · 0.35mm/px · 2 of 32 slices shown (3 of 3)]
[im 1/32]
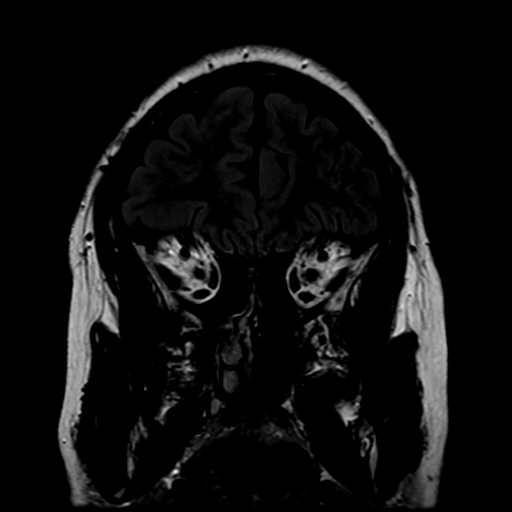
[im 32/32]
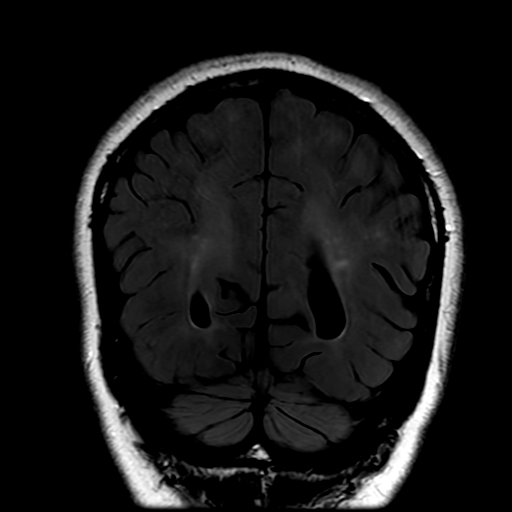

[Series 250: ADC · axial · 3.0mm · 0.94mm/px · z∈[-79,+67]mm · 2 of 50 slices shown (1 of 2)]
[im 1/50]
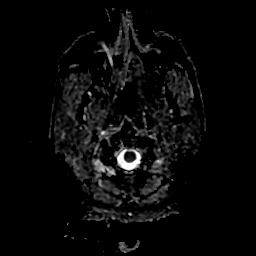
[im 50/50]
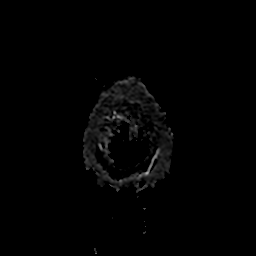

[Series 350: ADC · coronal · 4.0mm · 0.94mm/px · 2 of 37 slices shown (2 of 2)]
[im 1/37]
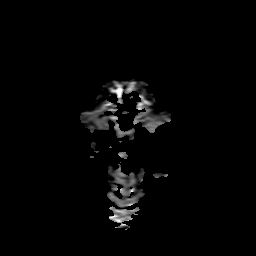
[im 37/37]
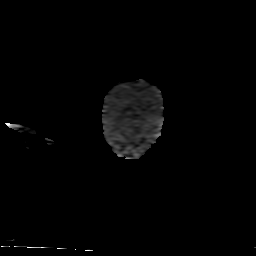

[20 of 48 positions shown; findings below may reference images not displayed]

FINDINGS: Brain: No restricted diffusion to suggest acute or subacute infarct.
No acute hemorrhage, mass, mass effect, or midline shift. No
hydrocephalus or extra-axial collection.

The hippocampi are symmetric in signal, with mildly decreased size
of the left hippocampus compared to the right, favored to reflect
more generalized mild age-related volume loss. No heterotopia or
cortical dysgenesis. T2 hyperintense signal in the periventricular
white matter, likely the sequela of chronic small vessel ischemic
disease.

Vascular: Normal flow voids.

Skull and upper cervical spine: Normal marrow signal.

Sinuses/Orbits: No acute finding.

Other: Fluid in the left mastoid air cells.
IMPRESSION: No acute intracranial process. No definite seizure etiology
identified.

## 2023-06-25 ENCOUNTER — Inpatient Hospital Stay: Payer: BC Managed Care – PPO

## 2023-06-25 ENCOUNTER — Encounter: Payer: Self-pay | Admitting: *Deleted

## 2023-06-25 ENCOUNTER — Other Ambulatory Visit: Payer: Self-pay

## 2023-06-25 ENCOUNTER — Encounter: Payer: Self-pay | Admitting: Hematology & Oncology

## 2023-06-25 ENCOUNTER — Inpatient Hospital Stay: Payer: BC Managed Care – PPO | Admitting: Hematology & Oncology

## 2023-06-25 VITALS — BP 113/70 | HR 90 | Temp 97.7°F | Resp 18

## 2023-06-25 VITALS — BP 128/81 | HR 95 | Temp 97.5°F | Resp 18 | Ht 63.5 in | Wt 207.0 lb

## 2023-06-25 DIAGNOSIS — C8218 Follicular lymphoma grade II, lymph nodes of multiple sites: Secondary | ICD-10-CM

## 2023-06-25 DIAGNOSIS — Z83719 Family history of colon polyps, unspecified: Secondary | ICD-10-CM | POA: Insufficient documentation

## 2023-06-25 DIAGNOSIS — Z800 Family history of malignant neoplasm of digestive organs: Secondary | ICD-10-CM | POA: Insufficient documentation

## 2023-06-25 DIAGNOSIS — Z95828 Presence of other vascular implants and grafts: Secondary | ICD-10-CM

## 2023-06-25 DIAGNOSIS — Z5112 Encounter for antineoplastic immunotherapy: Secondary | ICD-10-CM | POA: Diagnosis not present

## 2023-06-25 LAB — CBC WITH DIFFERENTIAL (CANCER CENTER ONLY)
Abs Immature Granulocytes: 0.06 10*3/uL (ref 0.00–0.07)
Basophils Absolute: 0 10*3/uL (ref 0.0–0.1)
Basophils Relative: 1 %
Eosinophils Absolute: 0.1 10*3/uL (ref 0.0–0.5)
Eosinophils Relative: 1 %
HCT: 31.6 % — ABNORMAL LOW (ref 36.0–46.0)
Hemoglobin: 11.2 g/dL — ABNORMAL LOW (ref 12.0–15.0)
Immature Granulocytes: 1 %
Lymphocytes Relative: 15 %
Lymphs Abs: 1.2 10*3/uL (ref 0.7–4.0)
MCH: 35 pg — ABNORMAL HIGH (ref 26.0–34.0)
MCHC: 35.4 g/dL (ref 30.0–36.0)
MCV: 98.8 fL (ref 80.0–100.0)
Monocytes Absolute: 0.9 10*3/uL (ref 0.1–1.0)
Monocytes Relative: 11 %
Neutro Abs: 5.6 10*3/uL (ref 1.7–7.7)
Neutrophils Relative %: 71 %
Platelet Count: 158 10*3/uL (ref 150–400)
RBC: 3.2 MIL/uL — ABNORMAL LOW (ref 3.87–5.11)
RDW: 15.2 % (ref 11.5–15.5)
WBC Count: 7.8 10*3/uL (ref 4.0–10.5)
nRBC: 0 % (ref 0.0–0.2)

## 2023-06-25 LAB — CMP (CANCER CENTER ONLY)
ALT: 19 U/L (ref 0–44)
AST: 25 U/L (ref 15–41)
Albumin: 4.4 g/dL (ref 3.5–5.0)
Alkaline Phosphatase: 76 U/L (ref 38–126)
Anion gap: 12 (ref 5–15)
BUN: 12 mg/dL (ref 6–20)
CO2: 25 mmol/L (ref 22–32)
Calcium: 8.8 mg/dL — ABNORMAL LOW (ref 8.9–10.3)
Chloride: 100 mmol/L (ref 98–111)
Creatinine: 1.06 mg/dL — ABNORMAL HIGH (ref 0.44–1.00)
GFR, Estimated: >60 mL/min (ref >60)
Glucose, Bld: 90 mg/dL (ref 70–99)
Potassium: 3.9 mmol/L (ref 3.5–5.1)
Sodium: 137 mmol/L (ref 135–145)
Total Bilirubin: 0.6 mg/dL (ref 0.0–1.2)
Total Protein: 6.9 g/dL (ref 6.5–8.1)

## 2023-06-25 LAB — LACTATE DEHYDROGENASE: LDH: 219 U/L — ABNORMAL HIGH (ref 98–192)

## 2023-06-25 MED ORDER — HEPARIN SOD (PORK) LOCK FLUSH 100 UNIT/ML IV SOLN
500.0000 [IU] | Freq: Once | INTRAVENOUS | Status: AC | PRN
  Administered 2023-06-25: 500 [IU]

## 2023-06-25 MED ORDER — SODIUM CHLORIDE 0.9 % IV SOLN
INTRAVENOUS | Status: DC
Start: 2023-06-25 — End: 2023-06-25

## 2023-06-25 MED ORDER — ACETAMINOPHEN 325 MG PO TABS
650.0000 mg | ORAL_TABLET | Freq: Once | ORAL | Status: AC
Start: 2023-06-25 — End: 2023-06-25
  Administered 2023-06-25: 650 mg via ORAL
  Filled 2023-06-25: qty 2

## 2023-06-25 MED ORDER — SODIUM CHLORIDE 0.9 % IV SOLN
81.0000 mg/m2 | Freq: Once | INTRAVENOUS | Status: AC
  Administered 2023-06-25: 165 mg via INTRAVENOUS
  Filled 2023-06-25: qty 6.6

## 2023-06-25 MED ORDER — DIPHENHYDRAMINE HCL 25 MG PO CAPS
50.0000 mg | ORAL_CAPSULE | Freq: Once | ORAL | Status: AC
  Administered 2023-06-25: 50 mg via ORAL
  Filled 2023-06-25: qty 2

## 2023-06-25 MED ORDER — PALONOSETRON HCL INJECTION 0.25 MG/5ML
0.2500 mg | Freq: Once | INTRAVENOUS | Status: AC
  Administered 2023-06-25: 0.25 mg via INTRAVENOUS
  Filled 2023-06-25: qty 5

## 2023-06-25 MED ORDER — SODIUM CHLORIDE 0.9% FLUSH
10.0000 mL | Freq: Once | INTRAVENOUS | Status: AC
Start: 2023-06-25 — End: 2023-06-25
  Administered 2023-06-25: 10 mL via INTRAVENOUS

## 2023-06-25 MED ORDER — SODIUM CHLORIDE 0.9% FLUSH
10.0000 mL | INTRAVENOUS | Status: DC | PRN
  Administered 2023-06-25: 10 mL

## 2023-06-25 MED ORDER — DEXAMETHASONE SODIUM PHOSPHATE 10 MG/ML IJ SOLN
10.0000 mg | Freq: Once | INTRAMUSCULAR | Status: AC
Start: 2023-06-25 — End: 2023-06-25
  Administered 2023-06-25: 10 mg via INTRAVENOUS
  Filled 2023-06-25: qty 1

## 2023-06-25 MED ORDER — SODIUM CHLORIDE 0.9 % IV SOLN
375.0000 mg/m2 | Freq: Once | INTRAVENOUS | Status: AC
  Administered 2023-06-25: 800 mg via INTRAVENOUS
  Filled 2023-06-25: qty 50

## 2023-06-25 NOTE — Progress Notes (Signed)
Patient will proceed with cycle four.   Oncology Nurse Navigator Documentation     06/25/2023    8:45 AM  Oncology Nurse Navigator Flowsheets  Navigator Follow Up Date: 07/23/2023  Navigator Follow Up Reason: Follow-up Appointment;Chemotherapy  Navigator Location [facility]  Navigator Encounter Type Follow-up Appt;Treatment  Patient Visit Type MedOnc  Treatment Phase Active Tx  Barriers/Navigation Needs No Barriers At This Time  Interventions Psycho-Social Support  Acuity Level 1-No Barriers  Support Groups/Services Friends and Family  Time Spent with Patient 15

## 2023-06-25 NOTE — Progress Notes (Signed)
Hematology and Oncology Follow Up Visit  Bonnie Russell [id number] January 17, 1966 57 y.o. 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  She looks good.  She feels good.  She really has had no complaints.  There has been a little bit of cough.  She may have little bit of shortness of breath.  There is no fever.  She has had no bleeding.  There is been no nausea or vomiting.  She has had no change in bowel or bladder habits.  She has had no rashes.  There has been no leg swelling.  She has had no problems with pain.  She has not noted any swollen lymph nodes.  Overall, I would say that her performance status is probably ECOG 1.     Medications:  Current Outpatient Medications:    ALPRAZolam (XANAX) 0.5 MG tablet, Take 1 tablet (0.5 mg total) by mouth 2 (two) times daily as needed for anxiety, Disp: 30 tablet, Rfl: 2   amLODipine (NORVASC) 5 MG tablet, Take 1 tablet (5 mg total) by mouth daily., Disp: 30 tablet, Rfl: 1   aspirin-acetaminophen-caffeine (EXCEDRIN MIGRAINE) 250-250-65 MG tablet, Take by mouth every 6 (six) hours as needed for headache., Disp: , Rfl:    dexamethasone (DECADRON) 4 MG tablet, Take 2 tablets (8 mg total) by mouth daily. Start the day after bendamustine chemotherapy for 2 days. Take with food., Disp: 30 tablet, Rfl: 1   diphenhydrAMINE HCl, Sleep, (ZZZQUIL) 50 MG/30ML LIQD, Take 1 Capful by mouth at bedtime as needed., Disp: , Rfl:    DULoxetine (CYMBALTA) 60 MG capsule, Take 1 capsule (60 mg total) by mouth daily., Disp: 90 capsule, Rfl: 2   metoprolol succinate (TOPROL-XL) 50 MG 24 hr tablet, Take 1 tablet by mouth daily., Disp: , Rfl:    Multiple Vitamin (MULTIVITAMIN WITH MINERALS) TABS tablet, Take 1 tablet by mouth daily., Disp: 30 tablet, Rfl: 0   ondansetron (ZOFRAN) 8 MG  tablet, Take 1 tablet (8 mg total) by mouth every 8 (eight) hours as needed for nausea or vomiting. Start on the third day after chemotherapy., Disp: 30 tablet, Rfl: 1   prochlorperazine (COMPAZINE) 10 MG tablet, Take 1 tablet (10 mg total) by mouth every 6 (six) hours as needed for nausea or vomiting., Disp: 30 tablet, Rfl: 1   valACYclovir (VALTREX) 500 MG tablet, Take 500 mg by mouth daily., Disp: , Rfl:   Allergies:  Allergies  Allergen Reactions   Morphine Itching   Sertraline Other (See Comments) and Diarrhea    Diarrhea   Hydroxyzine     Did not like how it made her feel   Morphine And Codeine Itching   Peanut (Diagnostic) Other (See Comments)    Occasionally causes "eye swelling"   Shellfish Allergy Other (See Comments)    Occasionally causes "eye swelling"    Past Medical History, Surgical history, Social history, and Family History were reviewed and updated.  Review of Systems: Review of Systems  Constitutional: Negative.   HENT:  Negative.    Eyes: Negative.   Respiratory: Negative.    Cardiovascular: Negative.   Gastrointestinal: Negative.   Endocrine: Negative.   Genitourinary: Negative.    Musculoskeletal: Negative.   Skin: Negative.   Neurological: Negative.   Hematological: Negative.   Psychiatric/Behavioral: Negative.      Physical Exam:  Vital  signs are temperature of 97.5.  Pulse 95.  Blood pressure 128/81.  Weight is 207 pounds.    Wt Readings from Last 3 Encounters:  06/25/23 207 lb (93.9 kg)  05/28/23 209 lb 1.9 oz (94.9 kg)  04/30/23 206 lb (93.4 kg)    Physical Exam Vitals reviewed.  HENT:     Head: Normocephalic and atraumatic.  Eyes:     Pupils: Pupils are equal, round, and reactive to light.  Cardiovascular:     Rate and Rhythm: Normal rate and regular rhythm.     Heart sounds: Normal heart sounds.  Pulmonary:     Effort: Pulmonary effort is normal.     Breath sounds: Normal breath sounds.  Abdominal:     General: Bowel sounds  are normal.     Palpations: Abdomen is soft.  Musculoskeletal:        General: No tenderness or deformity. Normal range of motion.     Cervical back: Normal range of motion.  Lymphadenopathy:     Cervical: No cervical adenopathy.  Skin:    General: Skin is warm and dry.     Findings: No erythema or rash.  Neurological:     Mental Status: She is alert and oriented to person, place, and time.  Psychiatric:        Behavior: Behavior normal.        Thought Content: Thought content normal.        Judgment: Judgment normal.      Lab Results  Component Value Date   WBC 7.8 06/25/2023   HGB 11.2 (L) 06/25/2023   HCT 31.6 (L) 06/25/2023   MCV 98.8 06/25/2023   PLT 158 06/25/2023     Chemistry      Component Value Date/Time   NA 135 05/28/2023 0815   K 3.8 05/28/2023 0815   CL 102 05/28/2023 0815   CO2 23 05/28/2023 0815   BUN 14 05/28/2023 0815   CREATININE 0.94 05/28/2023 0815      Component Value Date/Time   CALCIUM 8.2 (L) 05/28/2023 0815   ALKPHOS 82 05/28/2023 0815   AST 20 05/28/2023 0815   ALT 17 05/28/2023 0815   BILITOT 0.7 05/28/2023 0815       Impression and Plan: Bonnie Russell is a very nice 58 year old white female.  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  She has an intermediate prognostic scale.  Again, she has responded so well.  I am not surprised.  We will proceed with treatment.  This will be her fourth cycle of treatment.    Again, I think that a total 6 cycles of treatment is probably what we will need.  I am not sure that she will need any maintenance treatment afterwards.  We will see her back in 1 month.    Josph Macho, MD 1/30/20258:58 AM

## 2023-06-25 NOTE — Patient Instructions (Addendum)
[facility]  Discharge Instructions: Thank you for choosing [facility] to provide your oncology and hematology care.   If you have a lab appointment with the Cancer Center, please go directly to the Cancer Center and check in at the registration area.  Wear comfortable clothing and clothing appropriate for easy access to any Portacath or PICC line.   We strive to give you quality time with your provider. You may need to reschedule your appointment if you arrive late (15 or more minutes).  Arriving late affects you and other patients whose appointments are after yours.  Also, if you miss three or more appointments without notifying the office, you may be dismissed from the clinic at the provider's discretion.      For prescription refill requests, have your pharmacy contact our office and allow 72 hours for refills to be completed.    Today you received the following chemotherapy and/or immunotherapy agents rituxan, bendeka      To help prevent nausea and vomiting after your treatment, we encourage you to take your nausea medication as directed.  BELOW ARE SYMPTOMS THAT SHOULD BE REPORTED IMMEDIATELY: *FEVER GREATER THAN 100.4 F (38 C) OR HIGHER *CHILLS OR SWEATING *NAUSEA AND VOMITING THAT IS NOT CONTROLLED WITH YOUR NAUSEA MEDICATION *UNUSUAL SHORTNESS OF BREATH *UNUSUAL BRUISING OR BLEEDING *URINARY PROBLEMS (pain or burning when urinating, or frequent urination) *BOWEL PROBLEMS (unusual diarrhea, constipation, pain near the anus) TENDERNESS IN MOUTH AND THROAT WITH OR WITHOUT PRESENCE OF ULCERS (sore throat, sores in mouth, or a toothache) UNUSUAL RASH, SWELLING OR PAIN  UNUSUAL VAGINAL DISCHARGE OR ITCHING   Items with * indicate a potential emergency and should be followed up as soon as possible or go to the Emergency Department if any problems should occur.  Please show the CHEMOTHERAPY ALERT CARD or  IMMUNOTHERAPY ALERT CARD at check-in to the Emergency Department and triage nurse. Should you have questions after your visit or need to cancel or reschedule your appointment, please contact [facility] - A DEPT OF Eligha Bridegroom [facility]  [phone] and follow the prompts.  Office hours are 8:00 a.m. to 4:30 p.m. Monday - Friday. Please note that voicemails left after 4:00 p.m. may not be returned until the following business day.  We are closed weekends and major holidays. You have access to a nurse at all times for urgent questions. Please call the main number to the clinic [phone] and follow the prompts.  For any non-urgent questions, you may also contact your provider using MyChart. We now offer e-Visits for anyone 45 and older to request care online for non-urgent symptoms. For details visit [email or web address].   Also download the MyChart app! Go to the app store, search "MyChart", open the app, select [place], and log in with your MyChart username and password.  [facility]  Discharge Instructions: Thank you for choosing [facility] to provide your oncology and hematology care.   If you have a lab appointment with the Cancer Center, please go directly to the Cancer Center and check in at the registration area.  Wear comfortable clothing and clothing appropriate for easy access to any Portacath or PICC line.   We strive to give you quality time with your provider. You may need to reschedule your appointment if you arrive late (15 or  more minutes).  Arriving late affects you and other patients whose appointments are after yours.  Also, if you miss three or more appointments without notifying the office, you may be dismissed from the clinic at the provider's discretion.      For prescription refill requests, have your pharmacy contact our office and allow 72 hours for refills to be  completed.    Today you received the following chemotherapy and/or immunotherapy agents rituxan, bendeka      To help prevent nausea and vomiting after your treatment, we encourage you to take your nausea medication as directed.  BELOW ARE SYMPTOMS THAT SHOULD BE REPORTED IMMEDIATELY: *FEVER GREATER THAN 100.4 F (38 C) OR HIGHER *CHILLS OR SWEATING *NAUSEA AND VOMITING THAT IS NOT CONTROLLED WITH YOUR NAUSEA MEDICATION *UNUSUAL SHORTNESS OF BREATH *UNUSUAL BRUISING OR BLEEDING *URINARY PROBLEMS (pain or burning when urinating, or frequent urination) *BOWEL PROBLEMS (unusual diarrhea, constipation, pain near the anus) TENDERNESS IN MOUTH AND THROAT WITH OR WITHOUT PRESENCE OF ULCERS (sore throat, sores in mouth, or a toothache) UNUSUAL RASH, SWELLING OR PAIN  UNUSUAL VAGINAL DISCHARGE OR ITCHING   Items with * indicate a potential emergency and should be followed up as soon as possible or go to the Emergency Department if any problems should occur.  Please show the CHEMOTHERAPY ALERT CARD or IMMUNOTHERAPY ALERT CARD at check-in to the Emergency Department and triage nurse. Should you have questions after your visit or need to cancel or reschedule your appointment, please contact [facility] - A DEPT OF [facility]  [phone] and follow the prompts.  Office hours are 8:00 a.m. to 4:30 p.m. Monday - Friday. Please note that voicemails left after 4:00 p.m. may not be returned until the following business day.  We are closed weekends and major holidays. You have access to a nurse at all times for urgent questions. Please call the main number to the clinic [phone] and follow the prompts.  For any non-urgent questions, you may also contact your provider using MyChart. We now offer e-Visits for anyone 8 and older to request care online for non-urgent symptoms. For details visit [email or web address].   Also download the MyChart app! Go to the app store,  search "MyChart", open the app, select [place], and log in with your MyChart username and password.

## 2023-06-26 ENCOUNTER — Inpatient Hospital Stay: Payer: BC Managed Care – PPO

## 2023-06-26 ENCOUNTER — Other Ambulatory Visit: Payer: Self-pay

## 2023-06-26 ENCOUNTER — Other Ambulatory Visit: Payer: Self-pay | Admitting: Hematology & Oncology

## 2023-06-26 VITALS — BP 125/81 | HR 96 | Temp 98.1°F | Resp 19

## 2023-06-26 DIAGNOSIS — Z5112 Encounter for antineoplastic immunotherapy: Secondary | ICD-10-CM | POA: Diagnosis not present

## 2023-06-26 DIAGNOSIS — C8218 Follicular lymphoma grade II, lymph nodes of multiple sites: Secondary | ICD-10-CM

## 2023-06-26 MED ORDER — HEPARIN SOD (PORK) LOCK FLUSH 100 UNIT/ML IV SOLN
500.0000 [IU] | Freq: Once | INTRAVENOUS | Status: AC | PRN
  Administered 2023-06-26: 500 [IU]

## 2023-06-26 MED ORDER — FUROSEMIDE 10 MG/ML IJ SOLN
20.0000 mg | Freq: Once | INTRAMUSCULAR | Status: AC
Start: 2023-06-26 — End: 2023-06-26
  Administered 2023-06-26: 20 mg via INTRAMUSCULAR
  Filled 2023-06-26: qty 4

## 2023-06-26 MED ORDER — DEXAMETHASONE SODIUM PHOSPHATE 10 MG/ML IJ SOLN
10.0000 mg | Freq: Once | INTRAMUSCULAR | Status: DC
  Filled 2023-06-26: qty 1

## 2023-06-26 MED ORDER — SODIUM CHLORIDE 0.9% FLUSH
10.0000 mL | INTRAVENOUS | Status: DC | PRN
  Administered 2023-06-26: 10 mL

## 2023-06-26 MED ORDER — SODIUM CHLORIDE 0.9 % IV SOLN
81.0000 mg/m2 | Freq: Once | INTRAVENOUS | Status: AC
  Administered 2023-06-26: 165 mg via INTRAVENOUS
  Filled 2023-06-26: qty 6.6

## 2023-06-26 NOTE — Progress Notes (Signed)
Okay to treat with HR 106, will recheck.

## 2023-06-26 NOTE — Progress Notes (Signed)
Day 2 missing from today's careplan. Re-entered per Dr. Gustavo Lah instructions.

## 2023-07-23 ENCOUNTER — Inpatient Hospital Stay: Payer: BC Managed Care – PPO

## 2023-07-23 ENCOUNTER — Encounter: Payer: Self-pay | Admitting: Hematology & Oncology

## 2023-07-23 ENCOUNTER — Inpatient Hospital Stay: Payer: BC Managed Care – PPO | Admitting: Hematology & Oncology

## 2023-07-23 NOTE — Telephone Encounter (Signed)
 Called Ms. Bullins-Phillips to inquire about her missing her appointment. Patient states she has been up since the middle of the night vomiting, having diarrhea and nauseous. Patient states she has been experiencing these symptoms x 2 weeks intermittently. Patient denies fever, SOB, dizziness, weakness or headache.  This RN offered the patient an appointment for labs and fluids. However, she didn't feel as if she could come in today she is tired.Patient states she is drinking Tajia Ale and has taken her prescribed nausea medicine which has resolved the nausea. This RN advised patient to drink Gatorade or Pedialyte to replace electrolytes that are often lost during episodes of diarrhea and vomiting. Patient instructed and verbalized understanding she is to call our office if symptoms worsen or if she changes her mind about wanting an appointment for labs/fluids. Dr. Myna Hidalgo notified. Patient is to reschedule the missed chemotherapy cycle for 2 weeks. Scheduling message sent.

## 2023-07-24 ENCOUNTER — Inpatient Hospital Stay: Payer: BC Managed Care – PPO

## 2023-07-26 ENCOUNTER — Other Ambulatory Visit: Payer: Self-pay

## 2023-07-28 ENCOUNTER — Other Ambulatory Visit: Payer: Self-pay

## 2023-08-05 ENCOUNTER — Telehealth: Payer: Self-pay | Admitting: *Deleted

## 2023-08-05 NOTE — Telephone Encounter (Signed)
 Received a call from patient stating that she is having the same swelling in her neck that she had around her treatment time but she hasn't had a treatment recently.  She feels like she has been  "hit by a truck"  Her body feels very sore.  No tongue swelling or difficulty swallowing.  Patient states she is not in distress but felt we should know.  Dr Myna Hidalgo notified of above and wants patient to see APP tomorrow.  Patient declined stating she will come in on Monday for her regular appt to see Dr Myna Hidalgo.  Advised patient to go to the ED if her symptoms worsen.  Patient understands instructions.

## 2023-08-10 ENCOUNTER — Inpatient Hospital Stay

## 2023-08-10 ENCOUNTER — Inpatient Hospital Stay: Attending: Hematology & Oncology

## 2023-08-10 ENCOUNTER — Encounter: Payer: Self-pay | Admitting: Hematology & Oncology

## 2023-08-10 ENCOUNTER — Inpatient Hospital Stay (HOSPITAL_BASED_OUTPATIENT_CLINIC_OR_DEPARTMENT_OTHER): Admitting: Hematology & Oncology

## 2023-08-10 VITALS — BP 110/78 | HR 91

## 2023-08-10 VITALS — BP 118/84 | HR 98 | Temp 97.5°F | Resp 17 | Ht 63.5 in | Wt 211.0 lb

## 2023-08-10 DIAGNOSIS — C8218 Follicular lymphoma grade II, lymph nodes of multiple sites: Secondary | ICD-10-CM

## 2023-08-10 DIAGNOSIS — Z5112 Encounter for antineoplastic immunotherapy: Secondary | ICD-10-CM | POA: Insufficient documentation

## 2023-08-10 DIAGNOSIS — Z5111 Encounter for antineoplastic chemotherapy: Secondary | ICD-10-CM | POA: Insufficient documentation

## 2023-08-10 DIAGNOSIS — C8290 Follicular lymphoma, unspecified, unspecified site: Secondary | ICD-10-CM | POA: Diagnosis present

## 2023-08-10 LAB — CBC WITH DIFFERENTIAL (CANCER CENTER ONLY)
Abs Immature Granulocytes: 0.08 10*3/uL — ABNORMAL HIGH (ref 0.00–0.07)
Basophils Absolute: 0.1 10*3/uL (ref 0.0–0.1)
Basophils Relative: 1 %
Eosinophils Absolute: 0.1 10*3/uL (ref 0.0–0.5)
Eosinophils Relative: 1 %
HCT: 35.1 % — ABNORMAL LOW (ref 36.0–46.0)
Hemoglobin: 12.4 g/dL (ref 12.0–15.0)
Immature Granulocytes: 1 %
Lymphocytes Relative: 14 %
Lymphs Abs: 1.1 10*3/uL (ref 0.7–4.0)
MCH: 35 pg — ABNORMAL HIGH (ref 26.0–34.0)
MCHC: 35.3 g/dL (ref 30.0–36.0)
MCV: 99.2 fL (ref 80.0–100.0)
Monocytes Absolute: 0.9 10*3/uL (ref 0.1–1.0)
Monocytes Relative: 11 %
Neutro Abs: 5.5 10*3/uL (ref 1.7–7.7)
Neutrophils Relative %: 72 %
Platelet Count: 268 10*3/uL (ref 150–400)
RBC: 3.54 MIL/uL — ABNORMAL LOW (ref 3.87–5.11)
RDW: 12.4 % (ref 11.5–15.5)
WBC Count: 7.7 10*3/uL (ref 4.0–10.5)
nRBC: 0 % (ref 0.0–0.2)

## 2023-08-10 LAB — CMP (CANCER CENTER ONLY)
ALT: 33 U/L (ref 0–44)
AST: 36 U/L (ref 15–41)
Albumin: 4.5 g/dL (ref 3.5–5.0)
Alkaline Phosphatase: 93 U/L (ref 38–126)
Anion gap: 15 (ref 5–15)
BUN: 13 mg/dL (ref 6–20)
CO2: 23 mmol/L (ref 22–32)
Calcium: 9.1 mg/dL (ref 8.9–10.3)
Chloride: 96 mmol/L — ABNORMAL LOW (ref 98–111)
Creatinine: 1.22 mg/dL — ABNORMAL HIGH (ref 0.44–1.00)
GFR, Estimated: 52 mL/min — ABNORMAL LOW (ref >60)
Glucose, Bld: 100 mg/dL — ABNORMAL HIGH (ref 70–99)
Potassium: 3.6 mmol/L (ref 3.5–5.1)
Sodium: 134 mmol/L — ABNORMAL LOW (ref 135–145)
Total Bilirubin: 0.5 mg/dL (ref 0.0–1.2)
Total Protein: 6.9 g/dL (ref 6.5–8.1)

## 2023-08-10 LAB — LACTATE DEHYDROGENASE: LDH: 212 U/L — ABNORMAL HIGH (ref 98–192)

## 2023-08-10 MED ORDER — SODIUM CHLORIDE 0.9% FLUSH
10.0000 mL | INTRAVENOUS | Status: DC | PRN
Start: 2023-08-10 — End: 2023-08-10
  Administered 2023-08-10: 10 mL

## 2023-08-10 MED ORDER — PALONOSETRON HCL INJECTION 0.25 MG/5ML
0.2500 mg | Freq: Once | INTRAVENOUS | Status: AC
  Administered 2023-08-10: 0.25 mg via INTRAVENOUS
  Filled 2023-08-10: qty 5

## 2023-08-10 MED ORDER — SODIUM CHLORIDE 0.9 % IV SOLN
375.0000 mg/m2 | Freq: Once | INTRAVENOUS | Status: DC
  Filled 2023-08-10: qty 80

## 2023-08-10 MED ORDER — DEXAMETHASONE SODIUM PHOSPHATE 10 MG/ML IJ SOLN
10.0000 mg | Freq: Once | INTRAMUSCULAR | Status: AC
  Administered 2023-08-10: 10 mg via INTRAVENOUS
  Filled 2023-08-10: qty 1

## 2023-08-10 MED ORDER — SODIUM CHLORIDE 0.9 % IV SOLN
375.0000 mg/m2 | Freq: Once | INTRAVENOUS | Status: AC
Start: 2023-08-10 — End: 2023-08-10
  Administered 2023-08-10: 800 mg via INTRAVENOUS
  Filled 2023-08-10: qty 50

## 2023-08-10 MED ORDER — SODIUM CHLORIDE 0.9 % IV SOLN
81.0000 mg/m2 | Freq: Once | INTRAVENOUS | Status: AC
  Administered 2023-08-10: 165 mg via INTRAVENOUS
  Filled 2023-08-10: qty 6.6

## 2023-08-10 MED ORDER — DIPHENHYDRAMINE HCL 25 MG PO CAPS
50.0000 mg | ORAL_CAPSULE | Freq: Once | ORAL | Status: AC
  Administered 2023-08-10: 50 mg via ORAL
  Filled 2023-08-10: qty 2

## 2023-08-10 MED ORDER — HEPARIN SOD (PORK) LOCK FLUSH 100 UNIT/ML IV SOLN
500.0000 [IU] | Freq: Once | INTRAVENOUS | Status: AC | PRN
  Administered 2023-08-10: 500 [IU]

## 2023-08-10 MED ORDER — ACETAMINOPHEN 325 MG PO TABS
650.0000 mg | ORAL_TABLET | Freq: Once | ORAL | Status: AC
  Administered 2023-08-10: 650 mg via ORAL
  Filled 2023-08-10: qty 2

## 2023-08-10 MED ORDER — SODIUM CHLORIDE 0.9 % IV SOLN: INTRAVENOUS | Status: DC

## 2023-08-10 NOTE — Progress Notes (Signed)
 Hematology and Oncology Follow Up Visit  Bonnie Russell [id number] 1965-10-03 58 y.o. 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  She looks good.  She feels good.  She did have a tough time last week.  There is a day where she woke up and felt like she was very sore.  She felt swelling on her neck bilaterally.  She does have a lot of aches and pains.  She has some cramps.  However, the next day everything seemed to begin better.  She is not losing weight.  She has gained weight.  I do not know if this might be part of why she could have had the neck swelling.  She has had no bleeding.  There is been no change in bowel or bladder habits.  She has had no swollen lymph nodes.  She has had no rashes.  There has been no headache.  Overall, I would have to say that her performance status is ECOG 1  Medications:  Current Outpatient Medications:    ALPRAZolam (XANAX) 0.5 MG tablet, Take 1 tablet (0.5 mg total) by mouth 2 (two) times daily as needed for anxiety, Disp: 30 tablet, Rfl: 2   amLODipine (NORVASC) 5 MG tablet, Take 1 tablet (5 mg total) by mouth daily., Disp: 30 tablet, Rfl: 1   aspirin-acetaminophen-caffeine (EXCEDRIN MIGRAINE) 250-250-65 MG tablet, Take by mouth every 6 (six) hours as needed for headache., Disp: , Rfl:    dexamethasone (DECADRON) 4 MG tablet, Take 2 tablets (8 mg total) by mouth daily. Start the day after bendamustine chemotherapy for 2 days. Take with food., Disp: 30 tablet, Rfl: 1   diphenhydrAMINE HCl, Sleep, (ZZZQUIL) 50 MG/30ML LIQD, Take 1 Capful by mouth at bedtime as needed., Disp: , Rfl:    DULoxetine (CYMBALTA) 60 MG capsule, Take 1 capsule (60 mg total) by mouth daily., Disp: 90 capsule, Rfl: 2   metoprolol succinate (TOPROL-XL) 50 MG 24 hr tablet, Take 1 tablet by mouth  daily., Disp: , Rfl:    Multiple Vitamin (MULTIVITAMIN WITH MINERALS) TABS tablet, Take 1 tablet by mouth daily., Disp: 30 tablet, Rfl: 0   prochlorperazine (COMPAZINE) 10 MG tablet, Take 1 tablet (10 mg total) by mouth every 6 (six) hours as needed for nausea or vomiting., Disp: 30 tablet, Rfl: 1   valACYclovir (VALTREX) 500 MG tablet, Take 500 mg by mouth daily., Disp: , Rfl:    ondansetron (ZOFRAN) 8 MG tablet, Take 1 tablet (8 mg total) by mouth every 8 (eight) hours as needed for nausea or vomiting. Start on the third day after chemotherapy. (Patient not taking: Reported on 08/10/2023), Disp: 30 tablet, Rfl: 1  Allergies:  Allergies  Allergen Reactions   Morphine Itching   Sertraline Other (See Comments) and Diarrhea    Diarrhea   Hydroxyzine     Did not like how it made her feel   Morphine And Codeine Itching   Peanut (Diagnostic) Other (See Comments)    Occasionally causes "eye swelling"   Shellfish Allergy Other (See Comments)    Occasionally causes "eye swelling"    Past Medical History, Surgical history, Social history, and Family History were reviewed and updated.  Review of Systems: Review of Systems  Constitutional: Negative.   HENT:  Negative.    Eyes: Negative.   Respiratory: Negative.    Cardiovascular:  Negative.   Gastrointestinal: Negative.   Endocrine: Negative.   Genitourinary: Negative.    Musculoskeletal: Negative.   Skin: Negative.   Neurological: Negative.   Hematological: Negative.   Psychiatric/Behavioral: Negative.      Physical Exam:  Vital signs are temperature of 97.5.  Pulse 95.  Blood pressure 128/81.  Weight is 211 pounds.    Wt Readings from Last 3 Encounters:  08/10/23 211 lb (95.7 kg)  06/25/23 207 lb (93.9 kg)  05/28/23 209 lb 1.9 oz (94.9 kg)    Physical Exam Vitals reviewed.  HENT:     Head: Normocephalic and atraumatic.  Eyes:     Pupils: Pupils are equal, round, and reactive to light.  Cardiovascular:     Rate and  Rhythm: Normal rate and regular rhythm.     Heart sounds: Normal heart sounds.  Pulmonary:     Effort: Pulmonary effort is normal.     Breath sounds: Normal breath sounds.  Abdominal:     General: Bowel sounds are normal.     Palpations: Abdomen is soft.  Musculoskeletal:        General: No tenderness or deformity. Normal range of motion.     Cervical back: Normal range of motion.  Lymphadenopathy:     Cervical: No cervical adenopathy.  Skin:    General: Skin is warm and dry.     Findings: No erythema or rash.  Neurological:     Mental Status: She is alert and oriented to person, place, and time.  Psychiatric:        Behavior: Behavior normal.        Thought Content: Thought content normal.        Judgment: Judgment normal.      Lab Results  Component Value Date   WBC 7.7 08/10/2023   HGB 12.4 08/10/2023   HCT 35.1 (L) 08/10/2023   MCV 99.2 08/10/2023   PLT 268 08/10/2023     Chemistry      Component Value Date/Time   NA 137 06/25/2023 0808   K 3.9 06/25/2023 0808   CL 100 06/25/2023 0808   CO2 25 06/25/2023 0808   BUN 12 06/25/2023 0808   CREATININE 1.06 (H) 06/25/2023 0808      Component Value Date/Time   CALCIUM 8.8 (L) 06/25/2023 0808   ALKPHOS 76 06/25/2023 0808   AST 25 06/25/2023 0808   ALT 19 06/25/2023 0808   BILITOT 0.6 06/25/2023 0808       Impression and Plan: Bonnie Russell is a very nice 58 year old white female.  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  She has an intermediate prognostic scale.  Again, she has responded so well.  I am not surprised.  We will proceed with treatment.  This will be her fourth cycle of treatment.    We had to delay her treatment a little bit because she was not feeling well last time as she is post have treatment.  I do not know she has some kind of viral syndrome.  I still think that 6 treatments will be enough.  We will do a PET scan after her 6 treatment.  I will plan to  get her back in 1 month.   Josph Macho, MD 3/17/20258:32 AM

## 2023-08-11 ENCOUNTER — Inpatient Hospital Stay

## 2023-08-11 VITALS — BP 115/82 | HR 86 | Temp 97.7°F | Resp 18

## 2023-08-11 DIAGNOSIS — C8218 Follicular lymphoma grade II, lymph nodes of multiple sites: Secondary | ICD-10-CM

## 2023-08-11 DIAGNOSIS — Z5112 Encounter for antineoplastic immunotherapy: Secondary | ICD-10-CM | POA: Diagnosis not present

## 2023-08-11 MED ORDER — SODIUM CHLORIDE 0.9 % IV SOLN
81.0000 mg/m2 | Freq: Once | INTRAVENOUS | Status: AC
  Administered 2023-08-11: 165 mg via INTRAVENOUS
  Filled 2023-08-11: qty 6.6

## 2023-08-11 MED ORDER — SODIUM CHLORIDE 0.9 % IV SOLN: INTRAVENOUS | Status: DC

## 2023-08-11 MED ORDER — SODIUM CHLORIDE 0.9% FLUSH
10.0000 mL | INTRAVENOUS | Status: DC | PRN
  Administered 2023-08-11: 10 mL

## 2023-08-11 MED ORDER — DEXAMETHASONE SODIUM PHOSPHATE 10 MG/ML IJ SOLN
10.0000 mg | Freq: Once | INTRAMUSCULAR | Status: AC
  Administered 2023-08-11: 10 mg via INTRAVENOUS
  Filled 2023-08-11: qty 1

## 2023-08-11 MED ORDER — HEPARIN SOD (PORK) LOCK FLUSH 100 UNIT/ML IV SOLN
500.0000 [IU] | Freq: Once | INTRAVENOUS | Status: AC | PRN
Start: 2023-08-11 — End: 2023-08-11
  Administered 2023-08-11: 500 [IU]

## 2023-08-11 NOTE — Patient Instructions (Signed)
 [facility]  Discharge Instructions: Thank you for choosing [facility] to provide your oncology and hematology care.   If you have a lab appointment with the Cancer Center, please go directly to the Cancer Center and check in at the registration area.  Wear comfortable clothing and clothing appropriate for easy access to any Portacath or PICC line.   We strive to give you quality time with your provider. You may need to reschedule your appointment if you arrive late (15 or more minutes).  Arriving late affects you and other patients whose appointments are after yours.  Also, if you miss three or more appointments without notifying the office, you may be dismissed from the clinic at the provider's discretion.      For prescription refill requests, have your pharmacy contact our office and allow 72 hours for refills to be completed.    Today you received the following chemotherapy and/or immunotherapy agents Bendeka.      To help prevent nausea and vomiting after your treatment, we encourage you to take your nausea medication as directed.  BELOW ARE SYMPTOMS THAT SHOULD BE REPORTED IMMEDIATELY: *FEVER GREATER THAN 100.4 F (38 C) OR HIGHER *CHILLS OR SWEATING *NAUSEA AND VOMITING THAT IS NOT CONTROLLED WITH YOUR NAUSEA MEDICATION *UNUSUAL SHORTNESS OF BREATH *UNUSUAL BRUISING OR BLEEDING *URINARY PROBLEMS (pain or burning when urinating, or frequent urination) *BOWEL PROBLEMS (unusual diarrhea, constipation, pain near the anus) TENDERNESS IN MOUTH AND THROAT WITH OR WITHOUT PRESENCE OF ULCERS (sore throat, sores in mouth, or a toothache) UNUSUAL RASH, SWELLING OR PAIN  UNUSUAL VAGINAL DISCHARGE OR ITCHING   Items with * indicate a potential emergency and should be followed up as soon as possible or go to the Emergency Department if any problems should occur.  Please show the CHEMOTHERAPY ALERT CARD or IMMUNOTHERAPY  ALERT CARD at check-in to the Emergency Department and triage nurse. Should you have questions after your visit or need to cancel or reschedule your appointment, please contact [facility] - A DEPT OF [facility]igh[facility] Bridegroom [facility]  [phone] and follow the prompts.  Office hours are 8:00 a.m. to 4:30 p.m. Monday - Friday. Please note that voicemails left after 4:00 p.m. may not be returned until the following business day.  We are closed weekends and major holidays. You have access to a nurse at all times for urgent questions. Please call the main number to the clinic [phone] and follow the prompts.  For any non-urgent questions, you may also contact your provider using MyChart. We now offer e-Visits for anyone 72 and older to request care online for non-urgent symptoms. For details visit [email or web address].   Also download the MyChart app! Go to the app store, search "MyChart", open the app, select [place], and log in with your MyChart username and password.

## 2023-08-12 ENCOUNTER — Encounter: Payer: Self-pay | Admitting: *Deleted

## 2023-08-12 ENCOUNTER — Other Ambulatory Visit: Payer: Self-pay

## 2023-08-12 NOTE — Progress Notes (Signed)
 Patient proceeded with cycle five as planned. PET will be needed after cycle six.   Oncology Nurse Navigator Documentation     08/12/2023    9:00 AM  Oncology Nurse Navigator Flowsheets  Navigator Follow Up Date: 09/07/2023  Navigator Follow Up Reason: Follow-up Appointment;Chemotherapy  Navigator Location [facility]  Navigator Encounter Type Appt/Treatment Plan Review  Patient Visit Type MedOnc  Treatment Phase Active Tx  Barriers/Navigation Needs No Barriers At This Time  Interventions None Required  Acuity Level 1-No Barriers  Support Groups/Services Friends and Family  Time Spent with Patient 15

## 2023-09-07 ENCOUNTER — Ambulatory Visit: Admitting: Family

## 2023-09-07 ENCOUNTER — Ambulatory Visit

## 2023-09-07 ENCOUNTER — Other Ambulatory Visit

## 2023-09-07 ENCOUNTER — Inpatient Hospital Stay

## 2023-09-08 ENCOUNTER — Other Ambulatory Visit: Payer: Self-pay

## 2023-09-08 ENCOUNTER — Encounter: Payer: Self-pay | Admitting: *Deleted

## 2023-09-08 ENCOUNTER — Inpatient Hospital Stay

## 2023-09-08 ENCOUNTER — Encounter: Payer: Self-pay | Admitting: Hematology & Oncology

## 2023-09-08 ENCOUNTER — Inpatient Hospital Stay (HOSPITAL_BASED_OUTPATIENT_CLINIC_OR_DEPARTMENT_OTHER): Admitting: Hematology & Oncology

## 2023-09-08 ENCOUNTER — Inpatient Hospital Stay: Attending: Hematology & Oncology

## 2023-09-08 VITALS — BP 110/74 | HR 88 | Temp 97.7°F | Resp 17

## 2023-09-08 VITALS — BP 116/81 | HR 97 | Temp 97.4°F | Resp 18 | Ht 63.5 in | Wt 214.0 lb

## 2023-09-08 DIAGNOSIS — Z5111 Encounter for antineoplastic chemotherapy: Secondary | ICD-10-CM | POA: Diagnosis present

## 2023-09-08 DIAGNOSIS — R197 Diarrhea, unspecified: Secondary | ICD-10-CM | POA: Insufficient documentation

## 2023-09-08 DIAGNOSIS — C8218 Follicular lymphoma grade II, lymph nodes of multiple sites: Secondary | ICD-10-CM

## 2023-09-08 DIAGNOSIS — C8290 Follicular lymphoma, unspecified, unspecified site: Secondary | ICD-10-CM | POA: Insufficient documentation

## 2023-09-08 DIAGNOSIS — Z5112 Encounter for antineoplastic immunotherapy: Secondary | ICD-10-CM | POA: Diagnosis present

## 2023-09-08 LAB — CMP (CANCER CENTER ONLY)
ALT: 33 U/L (ref 0–44)
AST: 42 U/L — ABNORMAL HIGH (ref 15–41)
Albumin: 4.3 g/dL (ref 3.5–5.0)
Alkaline Phosphatase: 93 U/L (ref 38–126)
Anion gap: 13 (ref 5–15)
BUN: 11 mg/dL (ref 6–20)
CO2: 24 mmol/L (ref 22–32)
Calcium: 8.6 mg/dL — ABNORMAL LOW (ref 8.9–10.3)
Chloride: 98 mmol/L (ref 98–111)
Creatinine: 1.03 mg/dL — ABNORMAL HIGH (ref 0.44–1.00)
GFR, Estimated: >60 mL/min (ref >60)
Glucose, Bld: 94 mg/dL (ref 70–99)
Potassium: 3.9 mmol/L (ref 3.5–5.1)
Sodium: 135 mmol/L (ref 135–145)
Total Bilirubin: 0.6 mg/dL (ref 0.0–1.2)
Total Protein: 6.4 g/dL — ABNORMAL LOW (ref 6.5–8.1)

## 2023-09-08 LAB — CBC WITH DIFFERENTIAL (CANCER CENTER ONLY)
Abs Immature Granulocytes: 0.05 10*3/uL (ref 0.00–0.07)
Basophils Absolute: 0.1 10*3/uL (ref 0.0–0.1)
Basophils Relative: 1 %
Eosinophils Absolute: 0.1 10*3/uL (ref 0.0–0.5)
Eosinophils Relative: 1 %
HCT: 32.9 % — ABNORMAL LOW (ref 36.0–46.0)
Hemoglobin: 11.7 g/dL — ABNORMAL LOW (ref 12.0–15.0)
Immature Granulocytes: 1 %
Lymphocytes Relative: 10 %
Lymphs Abs: 0.6 10*3/uL — ABNORMAL LOW (ref 0.7–4.0)
MCH: 34.7 pg — ABNORMAL HIGH (ref 26.0–34.0)
MCHC: 35.6 g/dL (ref 30.0–36.0)
MCV: 97.6 fL (ref 80.0–100.0)
Monocytes Absolute: 0.6 10*3/uL (ref 0.1–1.0)
Monocytes Relative: 10 %
Neutro Abs: 4.4 10*3/uL (ref 1.7–7.7)
Neutrophils Relative %: 77 %
Platelet Count: 156 10*3/uL (ref 150–400)
RBC: 3.37 MIL/uL — ABNORMAL LOW (ref 3.87–5.11)
RDW: 12.9 % (ref 11.5–15.5)
WBC Count: 5.7 10*3/uL (ref 4.0–10.5)
nRBC: 0 % (ref 0.0–0.2)

## 2023-09-08 LAB — LACTATE DEHYDROGENASE: LDH: 247 U/L — ABNORMAL HIGH (ref 98–192)

## 2023-09-08 MED ORDER — SODIUM CHLORIDE 0.9% FLUSH
10.0000 mL | INTRAVENOUS | Status: DC | PRN
Start: 2023-09-08 — End: 2023-09-08
  Administered 2023-09-08: 10 mL

## 2023-09-08 MED ORDER — SODIUM CHLORIDE 0.9 % IV SOLN
375.0000 mg/m2 | Freq: Once | INTRAVENOUS | Status: AC
  Administered 2023-09-08: 800 mg via INTRAVENOUS
  Filled 2023-09-08: qty 50

## 2023-09-08 MED ORDER — HEPARIN SOD (PORK) LOCK FLUSH 100 UNIT/ML IV SOLN
500.0000 [IU] | Freq: Once | INTRAVENOUS | Status: AC | PRN
  Administered 2023-09-08: 500 [IU]

## 2023-09-08 MED ORDER — PALONOSETRON HCL INJECTION 0.25 MG/5ML
0.2500 mg | Freq: Once | INTRAVENOUS | Status: AC
  Administered 2023-09-08: 0.25 mg via INTRAVENOUS
  Filled 2023-09-08: qty 5

## 2023-09-08 MED ORDER — DIPHENHYDRAMINE HCL 25 MG PO CAPS
50.0000 mg | ORAL_CAPSULE | Freq: Once | ORAL | Status: AC
  Administered 2023-09-08: 50 mg via ORAL
  Filled 2023-09-08: qty 2

## 2023-09-08 MED ORDER — ACETAMINOPHEN 325 MG PO TABS
650.0000 mg | ORAL_TABLET | Freq: Once | ORAL | Status: AC
  Administered 2023-09-08: 650 mg via ORAL
  Filled 2023-09-08: qty 2

## 2023-09-08 MED ORDER — SODIUM CHLORIDE 0.9 % IV SOLN: INTRAVENOUS | Status: DC

## 2023-09-08 MED ORDER — SODIUM CHLORIDE 0.9 % IV SOLN
81.0000 mg/m2 | Freq: Once | INTRAVENOUS | Status: AC
  Administered 2023-09-08: 165 mg via INTRAVENOUS
  Filled 2023-09-08: qty 6.6

## 2023-09-08 MED ORDER — DEXAMETHASONE SODIUM PHOSPHATE 10 MG/ML IJ SOLN
10.0000 mg | Freq: Once | INTRAMUSCULAR | Status: AC
  Administered 2023-09-08: 10 mg via INTRAVENOUS
  Filled 2023-09-08: qty 1

## 2023-09-08 NOTE — Progress Notes (Signed)
 Hematology and Oncology Follow Up Visit  Bonnie Russell [id number] 1965-08-21 58 y.o. 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  So far, she is tolerated treatment pretty well.  She really has had no specific complaints.  She does have occasional diarrhea.  Apparently, her sister is not doing well at all.  Her sister also has a hematologic disorder.  She is being followed by one of the other oncologist in town.  Bonnie Russell will have her last cycle of chemotherapy today.  We will then see about maintenance Rituxan for her.  She has had a good appetite.  She wants to try to lose a little weight if possible..  She has had no issues with bleeding.  She has had no rashes.  There is been no leg swelling.  She has had no cough or shortness of breath.  Overall, I would say that her performance status is probably ECOG 0.   Medications:  Current Outpatient Medications:    ALPRAZolam (XANAX) 0.5 MG tablet, Take 1 tablet (0.5 mg total) by mouth 2 (two) times daily as needed for anxiety, Disp: 30 tablet, Rfl: 2   amLODipine (NORVASC) 5 MG tablet, Take 1 tablet (5 mg total) by mouth daily., Disp: 30 tablet, Rfl: 1   aspirin-acetaminophen-caffeine (EXCEDRIN MIGRAINE) 250-250-65 MG tablet, Take by mouth every 6 (six) hours as needed for headache., Disp: , Rfl:    dexamethasone (DECADRON) 4 MG tablet, Take 2 tablets (8 mg total) by mouth daily. Start the day after bendamustine chemotherapy for 2 days. Take with food., Disp: 30 tablet, Rfl: 1   diphenhydrAMINE HCl, Sleep, (ZZZQUIL) 50 MG/30ML LIQD, Take 1 Capful by mouth at bedtime as needed., Disp: , Rfl:    DULoxetine (CYMBALTA) 60 MG capsule, Take 1 capsule (60 mg total) by mouth daily., Disp: 90 capsule, Rfl: 2   metoprolol succinate (TOPROL-XL) 50 MG 24 hr  tablet, Take 1 tablet by mouth daily., Disp: , Rfl:    Multiple Vitamin (MULTIVITAMIN WITH MINERALS) TABS tablet, Take 1 tablet by mouth daily., Disp: 30 tablet, Rfl: 0   ondansetron (ZOFRAN) 8 MG tablet, Take 1 tablet (8 mg total) by mouth every 8 (eight) hours as needed for nausea or vomiting. Start on the third day after chemotherapy., Disp: 30 tablet, Rfl: 1   prochlorperazine (COMPAZINE) 10 MG tablet, Take 1 tablet (10 mg total) by mouth every 6 (six) hours as needed for nausea or vomiting., Disp: 30 tablet, Rfl: 1   valACYclovir (VALTREX) 500 MG tablet, Take 500 mg by mouth daily., Disp: , Rfl:   Allergies:  Allergies  Allergen Reactions   Morphine Itching   Sertraline Other (See Comments) and Diarrhea    Diarrhea   Hydroxyzine     Did not like how it made her feel   Morphine And Codeine Itching   Peanut (Diagnostic) Other (See Comments)    Occasionally causes "eye swelling"   Shellfish Allergy Other (See Comments)    Occasionally causes "eye swelling"    Past Medical History, Surgical history, Social history, and Family History were reviewed and updated.  Review of Systems: Review of Systems  Constitutional: Negative.   HENT:  Negative.    Eyes: Negative.   Respiratory: Negative.    Cardiovascular: Negative.   Gastrointestinal: Negative.   Endocrine: Negative.   Genitourinary: Negative.  Musculoskeletal: Negative.   Skin: Negative.   Neurological: Negative.   Hematological: Negative.   Psychiatric/Behavioral: Negative.      Physical Exam:  Vital signs are temperature of 97.4.  Pulse 97.  Blood pressure 116/81.  Weight is 214 pounds.      Wt Readings from Last 3 Encounters:  09/08/23 214 lb (97.1 kg)  08/10/23 211 lb (95.7 kg)  06/25/23 207 lb (93.9 kg)    Physical Exam Vitals reviewed.  HENT:     Head: Normocephalic and atraumatic.  Eyes:     Pupils: Pupils are equal, round, and reactive to light.  Cardiovascular:     Rate and Rhythm: Normal rate and  regular rhythm.     Heart sounds: Normal heart sounds.  Pulmonary:     Effort: Pulmonary effort is normal.     Breath sounds: Normal breath sounds.  Abdominal:     General: Bowel sounds are normal.     Palpations: Abdomen is soft.  Musculoskeletal:        General: No tenderness or deformity. Normal range of motion.     Cervical back: Normal range of motion.  Lymphadenopathy:     Cervical: No cervical adenopathy.  Skin:    General: Skin is warm and dry.     Findings: No erythema or rash.  Neurological:     Mental Status: She is alert and oriented to person, place, and time.  Psychiatric:        Behavior: Behavior normal.        Thought Content: Thought content normal.        Judgment: Judgment normal.      Lab Results  Component Value Date   WBC 5.7 09/08/2023   HGB 11.7 (L) 09/08/2023   HCT 32.9 (L) 09/08/2023   MCV 97.6 09/08/2023   PLT 156 09/08/2023     Chemistry      Component Value Date/Time   NA 134 (L) 08/10/2023 0810   K 3.6 08/10/2023 0810   CL 96 (L) 08/10/2023 0810   CO2 23 08/10/2023 0810   BUN 13 08/10/2023 0810   CREATININE 1.22 (H) 08/10/2023 0810      Component Value Date/Time   CALCIUM 9.1 08/10/2023 0810   ALKPHOS 93 08/10/2023 0810   AST 36 08/10/2023 0810   ALT 33 08/10/2023 0810   BILITOT 0.5 08/10/2023 0810       Impression and Plan: Bonnie Russell is a very nice 58 year old white female.  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  She has an intermediate prognostic scale.  After this sixth cycle, we will then do a PET scan on her.  I will probably do this in about a month or so.  After that, I think Bonnie Russell Rituxan would not be a bad idea for her.  Her blood counts have been doing pretty well.  I think Bonnie Russell Rituxan will certainly help with respect to recurrence risk.  Hopefully, her sister will do better.  We will certainly stay strong in prayer for her.  Bonnie Russell has certainly had a strong  faith.  She has a great support from her family.  We will plan to get her back in 6 weeks.  At that point time, we will then see about maintenance Rituxan.    Bonnie Macho, MD 4/15/20259:33 AM

## 2023-09-08 NOTE — Patient Instructions (Signed)
 [facility]  Discharge Instructions: Thank you for choosing [facility] to provide your oncology and hematology care.   If you have a lab appointment with the Cancer Center, please go directly to the Cancer Center and check in at the registration area.  Wear comfortable clothing and clothing appropriate for easy access to any Portacath or PICC line.   We strive to give you quality time with your provider. You may need to reschedule your appointment if you arrive late (15 or more minutes).  Arriving late affects you and other patients whose appointments are after yours.  Also, if you miss three or more appointments without notifying the office, you may be dismissed from the clinic at the provider's discretion.      For prescription refill requests, have your pharmacy contact our office and allow 72 hours for refills to be completed.    Today you received the following chemotherapy and/or immunotherapy agents:  Rituxan and Bendeka       To help prevent nausea and vomiting after your treatment, we encourage you to take your nausea medication as directed.  BELOW ARE SYMPTOMS THAT SHOULD BE REPORTED IMMEDIATELY: *FEVER GREATER THAN 100.4 F (38 C) OR HIGHER *CHILLS OR SWEATING *NAUSEA AND VOMITING THAT IS NOT CONTROLLED WITH YOUR NAUSEA MEDICATION *UNUSUAL SHORTNESS OF BREATH *UNUSUAL BRUISING OR BLEEDING *URINARY PROBLEMS (pain or burning when urinating, or frequent urination) *BOWEL PROBLEMS (unusual diarrhea, constipation, pain near the anus) TENDERNESS IN MOUTH AND THROAT WITH OR WITHOUT PRESENCE OF ULCERS (sore throat, sores in mouth, or a toothache) UNUSUAL RASH, SWELLING OR PAIN  UNUSUAL VAGINAL DISCHARGE OR ITCHING   Items with * indicate a potential emergency and should be followed up as soon as possible or go to the Emergency Department if any problems should occur.  Please show the CHEMOTHERAPY ALERT CARD or  IMMUNOTHERAPY ALERT CARD at check-in to the Emergency Department and triage nurse. Should you have questions after your visit or need to cancel or reschedule your appointment, please contact [facility] - A DEPT OF [facility]  [phone] and follow the prompts.  Office hours are 8:00 a.m. to 4:30 p.m. Monday - Friday. Please note that voicemails left after 4:00 p.m. may not be returned until the following business day.  We are closed weekends and major holidays. You have access to a nurse at all times for urgent questions. Please call the main number to the clinic [phone] and follow the prompts.  For any non-urgent questions, you may also contact your provider using MyChart. We now offer e-Visits for anyone 64 and older to request care online for non-urgent symptoms. For details visit [email or web address].   Also download the MyChart app! Go to the app store, search "MyChart", open the app, select [facility], and log in with your MyChart username and password.

## 2023-09-08 NOTE — Progress Notes (Signed)
 Patient is here for her final chemo cycle. She has done well without significant side effects. She will have a PET and then likely proceed to maintenance Rituxan.   PET scheduled for 10/22/2023. Patient is aware of PET appointment including date, time, and location. The following prep is reviewed with patient and confirmed with teachback: - arrive 30 minutes before appointment time - NPO except water for 6h before scan. No candy, no gum - hold any diabetic medication the morning of the scan - have a low carb dinner the night prior Radiology Information sheet also given and reviewed with patient for reinforcement of education.  Oncology Nurse Navigator Documentation     09/08/2023    9:30 AM  Oncology Nurse Navigator Flowsheets  Phase of Treatment Chemotherapy  Chemotherapy Expected End Date: 09/09/2023  Navigator Follow Up Date: 10/22/2023  Navigator Follow Up Reason: Scan Review  Navigator Location [facility]  Navigator Encounter Type Treatment  Patient Visit Type MedOnc  Treatment Phase Final Chemo TX  Barriers/Navigation Needs Coordination of Care;Education  Education Other  Interventions Coordination of Care;Psycho-Social Support;Education  Acuity Level 1-No Barriers  Coordination of Care Radiology  Education Method Verbal;Written  Support Groups/Services Friends and Family  Time Spent with Patient 30

## 2023-09-09 ENCOUNTER — Inpatient Hospital Stay

## 2023-09-09 ENCOUNTER — Other Ambulatory Visit: Payer: Self-pay

## 2023-09-09 ENCOUNTER — Encounter: Payer: Self-pay | Admitting: Hematology & Oncology

## 2023-09-09 VITALS — BP 122/78 | HR 106 | Temp 97.9°F | Resp 18

## 2023-09-09 DIAGNOSIS — Z5112 Encounter for antineoplastic immunotherapy: Secondary | ICD-10-CM | POA: Diagnosis not present

## 2023-09-09 DIAGNOSIS — C8218 Follicular lymphoma grade II, lymph nodes of multiple sites: Secondary | ICD-10-CM

## 2023-09-09 MED ORDER — SODIUM CHLORIDE 0.9 % IV SOLN
81.0000 mg/m2 | Freq: Once | INTRAVENOUS | Status: AC
  Administered 2023-09-09: 165 mg via INTRAVENOUS
  Filled 2023-09-09: qty 6.6

## 2023-09-09 MED ORDER — SODIUM CHLORIDE 0.9% FLUSH
10.0000 mL | INTRAVENOUS | Status: DC | PRN
  Administered 2023-09-09: 10 mL

## 2023-09-09 MED ORDER — DEXAMETHASONE SODIUM PHOSPHATE 10 MG/ML IJ SOLN
10.0000 mg | Freq: Once | INTRAMUSCULAR | Status: AC
  Administered 2023-09-09: 10 mg via INTRAVENOUS
  Filled 2023-09-09: qty 1

## 2023-09-09 MED ORDER — SODIUM CHLORIDE 0.9 % IV SOLN: INTRAVENOUS | Status: DC

## 2023-09-09 MED ORDER — HEPARIN SOD (PORK) LOCK FLUSH 100 UNIT/ML IV SOLN
500.0000 [IU] | Freq: Once | INTRAVENOUS | Status: AC | PRN
Start: 2023-09-09 — End: 2023-09-09
  Administered 2023-09-09: 500 [IU]

## 2023-09-09 NOTE — Progress Notes (Signed)
 OK to treat today with HR of 106 per order of Dr. Maria Shiner.,

## 2023-09-09 NOTE — Addendum Note (Signed)
 Addended by: Gray Layman R on: 09/09/2023 02:18 PM   Modules accepted: Orders

## 2023-09-09 NOTE — Patient Instructions (Signed)
 [facility]  Discharge Instructions: Thank you for choosing [facility] to provide your oncology and hematology care.   If you have a lab appointment with the Cancer Center, please go directly to the Cancer Center and check in at the registration area.  Wear comfortable clothing and clothing appropriate for easy access to any Portacath or PICC line.   We strive to give you quality time with your provider. You may need to reschedule your appointment if you arrive late (15 or more minutes).  Arriving late affects you and other patients whose appointments are after yours.  Also, if you miss three or more appointments without notifying the office, you may be dismissed from the clinic at the provider's discretion.      For prescription refill requests, have your pharmacy contact our office and allow 72 hours for refills to be completed.    Today you received the following chemotherapy and/or immunotherapy agents Bendeka.      To help prevent nausea and vomiting after your treatment, we encourage you to take your nausea medication as directed.  BELOW ARE SYMPTOMS THAT SHOULD BE REPORTED IMMEDIATELY: *FEVER GREATER THAN 100.4 F (38 C) OR HIGHER *CHILLS OR SWEATING *NAUSEA AND VOMITING THAT IS NOT CONTROLLED WITH YOUR NAUSEA MEDICATION *UNUSUAL SHORTNESS OF BREATH *UNUSUAL BRUISING OR BLEEDING *URINARY PROBLEMS (pain or burning when urinating, or frequent urination) *BOWEL PROBLEMS (unusual diarrhea, constipation, pain near the anus) TENDERNESS IN MOUTH AND THROAT WITH OR WITHOUT PRESENCE OF ULCERS (sore throat, sores in mouth, or a toothache) UNUSUAL RASH, SWELLING OR PAIN  UNUSUAL VAGINAL DISCHARGE OR ITCHING   Items with * indicate a potential emergency and should be followed up as soon as possible or go to the Emergency Department if any problems should occur.  Please show the CHEMOTHERAPY ALERT CARD or IMMUNOTHERAPY  ALERT CARD at check-in to the Emergency Department and triage nurse. Should you have questions after your visit or need to cancel or reschedule your appointment, please contact [facility] - A DEPT OF [facility]igh[facility] Bridegroom [facility]  [phone] and follow the prompts.  Office hours are 8:00 a.m. to 4:30 p.m. Monday - Friday. Please note that voicemails left after 4:00 p.m. may not be returned until the following business day.  We are closed weekends and major holidays. You have access to a nurse at all times for urgent questions. Please call the main number to the clinic [phone] and follow the prompts.  For any non-urgent questions, you may also contact your provider using MyChart. We now offer e-Visits for anyone 72 and older to request care online for non-urgent symptoms. For details visit [email or web address].   Also download the MyChart app! Go to the app store, search "MyChart", open the app, select [place], and log in with your MyChart username and password.

## 2023-09-10 ENCOUNTER — Telehealth: Payer: Self-pay

## 2023-09-10 NOTE — Telephone Encounter (Signed)
 Clinical Social Work was referred by medical provider for assessment of psychosocial needs.  CSW attempted to contact patient by phone.  Left voicemail with contact information and request for return call.

## 2023-09-11 ENCOUNTER — Other Ambulatory Visit: Payer: Self-pay

## 2023-10-22 ENCOUNTER — Encounter (HOSPITAL_COMMUNITY)
Admission: RE | Admit: 2023-10-22 | Discharge: 2023-10-22 | Disposition: A | Discharge status: Home / Self Care | Source: Ambulatory Visit | Attending: Hematology & Oncology | Admitting: Hematology & Oncology

## 2023-10-22 DIAGNOSIS — C8218 Follicular lymphoma grade II, lymph nodes of multiple sites: Secondary | ICD-10-CM | POA: Diagnosis present

## 2023-10-22 LAB — GLUCOSE, CAPILLARY: Glucose-Capillary: 157 mg/dL — ABNORMAL HIGH (ref 70–99)

## 2023-10-22 MED ORDER — FLUDEOXYGLUCOSE F - 18 (FDG) INJECTION
10.6000 | Freq: Once | INTRAVENOUS | Status: AC
  Administered 2023-10-22: 10.8 via INTRAVENOUS

## 2023-10-23 ENCOUNTER — Ambulatory Visit: Payer: Self-pay | Admitting: Hematology & Oncology

## 2023-10-23 NOTE — Telephone Encounter (Signed)
 Advised via MyChart.

## 2023-10-23 NOTE — Telephone Encounter (Signed)
-----   Message from Ivor Mars sent at 10/23/2023  4:10 PM EDT ----- Please call and let her know that the PET scan looks fine without any evidence of lymphoma.  Great job.  Twilla Galea

## 2023-10-26 ENCOUNTER — Encounter: Payer: Self-pay | Admitting: *Deleted

## 2023-10-26 NOTE — Progress Notes (Signed)
 Reviewed PET which shows CR  Oncology Nurse Navigator Documentation     10/26/2023    7:45 AM  Oncology Nurse Navigator Flowsheets  Navigator Follow Up Date: 11/02/2023  Navigator Follow Up Reason: Follow-up Appointment  Navigator Location [facility]  Navigator Encounter Type Scan Review  Patient Visit Type MedOnc  Treatment Phase Post-Tx Follow-up  Barriers/Navigation Needs No Barriers At This Time  Interventions None Required  Acuity Level 1-No Barriers  Support Groups/Services Friends and Family  Time Spent with Patient 15

## 2023-11-02 ENCOUNTER — Encounter: Payer: Self-pay | Admitting: Hematology & Oncology

## 2023-11-02 ENCOUNTER — Inpatient Hospital Stay

## 2023-11-02 ENCOUNTER — Inpatient Hospital Stay (HOSPITAL_BASED_OUTPATIENT_CLINIC_OR_DEPARTMENT_OTHER): Admitting: Hematology & Oncology

## 2023-11-02 ENCOUNTER — Inpatient Hospital Stay: Attending: Hematology & Oncology

## 2023-11-02 VITALS — BP 132/86 | HR 107 | Temp 97.5°F | Resp 18 | Ht 63.5 in | Wt 218.9 lb

## 2023-11-02 VITALS — BP 126/87 | HR 96 | Temp 98.5°F | Resp 18

## 2023-11-02 DIAGNOSIS — C8218 Follicular lymphoma grade II, lymph nodes of multiple sites: Secondary | ICD-10-CM

## 2023-11-02 DIAGNOSIS — Z5112 Encounter for antineoplastic immunotherapy: Secondary | ICD-10-CM | POA: Diagnosis present

## 2023-11-02 DIAGNOSIS — C8290 Follicular lymphoma, unspecified, unspecified site: Secondary | ICD-10-CM | POA: Insufficient documentation

## 2023-11-02 LAB — LACTATE DEHYDROGENASE: LDH: 265 U/L — ABNORMAL HIGH (ref 98–192)

## 2023-11-02 LAB — CMP (CANCER CENTER ONLY)
ALT: 50 U/L — ABNORMAL HIGH (ref 0–44)
AST: 62 U/L — ABNORMAL HIGH (ref 15–41)
Albumin: 4.4 g/dL (ref 3.5–5.0)
Alkaline Phosphatase: 110 U/L (ref 38–126)
Anion gap: 13 (ref 5–15)
BUN: 11 mg/dL (ref 6–20)
CO2: 24 mmol/L (ref 22–32)
Calcium: 9 mg/dL (ref 8.9–10.3)
Chloride: 97 mmol/L — ABNORMAL LOW (ref 98–111)
Creatinine: 1.13 mg/dL — ABNORMAL HIGH (ref 0.44–1.00)
GFR, Estimated: 57 mL/min — ABNORMAL LOW (ref >60)
Glucose, Bld: 125 mg/dL — ABNORMAL HIGH (ref 70–99)
Potassium: 3.6 mmol/L (ref 3.5–5.1)
Sodium: 134 mmol/L — ABNORMAL LOW (ref 135–145)
Total Bilirubin: 0.9 mg/dL (ref 0.0–1.2)
Total Protein: 6.7 g/dL (ref 6.5–8.1)

## 2023-11-02 LAB — CBC WITH DIFFERENTIAL (CANCER CENTER ONLY)
Abs Immature Granulocytes: 0.05 10*3/uL (ref 0.00–0.07)
Basophils Absolute: 0 10*3/uL (ref 0.0–0.1)
Basophils Relative: 1 %
Eosinophils Absolute: 0.1 10*3/uL (ref 0.0–0.5)
Eosinophils Relative: 1 %
HCT: 32.4 % — ABNORMAL LOW (ref 36.0–46.0)
Hemoglobin: 11.4 g/dL — ABNORMAL LOW (ref 12.0–15.0)
Immature Granulocytes: 1 %
Lymphocytes Relative: 12 %
Lymphs Abs: 0.7 10*3/uL (ref 0.7–4.0)
MCH: 36.4 pg — ABNORMAL HIGH (ref 26.0–34.0)
MCHC: 35.2 g/dL (ref 30.0–36.0)
MCV: 103.5 fL — ABNORMAL HIGH (ref 80.0–100.0)
Monocytes Absolute: 0.7 10*3/uL (ref 0.1–1.0)
Monocytes Relative: 12 %
Neutro Abs: 4.1 10*3/uL (ref 1.7–7.7)
Neutrophils Relative %: 73 %
Platelet Count: 218 10*3/uL (ref 150–400)
RBC: 3.13 MIL/uL — ABNORMAL LOW (ref 3.87–5.11)
RDW: 15.6 % — ABNORMAL HIGH (ref 11.5–15.5)
WBC Count: 5.5 10*3/uL (ref 4.0–10.5)
nRBC: 0 % (ref 0.0–0.2)

## 2023-11-02 MED ORDER — DIPHENHYDRAMINE HCL 25 MG PO CAPS
50.0000 mg | ORAL_CAPSULE | Freq: Once | ORAL | Status: AC
  Administered 2023-11-02: 50 mg via ORAL
  Filled 2023-11-02: qty 2

## 2023-11-02 MED ORDER — SODIUM CHLORIDE 0.9% FLUSH
10.0000 mL | INTRAVENOUS | Status: DC | PRN
  Administered 2023-11-02: 10 mL

## 2023-11-02 MED ORDER — AZITHROMYCIN 250 MG PO TABS: ORAL_TABLET | ORAL | 0 refills | Status: DC

## 2023-11-02 MED ORDER — SODIUM CHLORIDE 0.9 % IV SOLN: INTRAVENOUS | Status: DC

## 2023-11-02 MED ORDER — ACETAMINOPHEN 325 MG PO TABS
650.0000 mg | ORAL_TABLET | Freq: Once | ORAL | Status: AC
  Administered 2023-11-02: 650 mg via ORAL
  Filled 2023-11-02: qty 2

## 2023-11-02 MED ORDER — SODIUM CHLORIDE 0.9 % IV SOLN
375.0000 mg/m2 | Freq: Once | INTRAVENOUS | Status: AC
  Administered 2023-11-02: 800 mg via INTRAVENOUS
  Filled 2023-11-02: qty 50

## 2023-11-02 MED ORDER — HEPARIN SOD (PORK) LOCK FLUSH 100 UNIT/ML IV SOLN
500.0000 [IU] | Freq: Once | INTRAVENOUS | Status: AC | PRN
  Administered 2023-11-02: 500 [IU]

## 2023-11-02 NOTE — Patient Instructions (Signed)
 [facility]  Discharge Instructions: Thank you for choosing [facility] to provide your oncology and hematology care.   If you have a lab appointment with the Cancer Center, please go directly to the Cancer Center and check in at the registration area.  Wear comfortable clothing and clothing appropriate for easy access to any Portacath or PICC line.   We strive to give you quality time with your provider. You may need to reschedule your appointment if you arrive late (15 or more minutes).  Arriving late affects you and other patients whose appointments are after yours.  Also, if you miss three or more appointments without notifying the office, you may be dismissed from the clinic at the provider's discretion.      For prescription refill requests, have your pharmacy contact our office and allow 72 hours for refills to be completed.    Today you received the following chemotherapy and/or immunotherapy agents Rituxan     To help prevent nausea and vomiting after your treatment, we encourage you to take your nausea medication as directed.  BELOW ARE SYMPTOMS THAT SHOULD BE REPORTED IMMEDIATELY: *FEVER GREATER THAN 100.4 F (38 C) OR HIGHER *CHILLS OR SWEATING *NAUSEA AND VOMITING THAT IS NOT CONTROLLED WITH YOUR NAUSEA MEDICATION *UNUSUAL SHORTNESS OF BREATH *UNUSUAL BRUISING OR BLEEDING *URINARY PROBLEMS (pain or burning when urinating, or frequent urination) *BOWEL PROBLEMS (unusual diarrhea, constipation, pain near the anus) TENDERNESS IN MOUTH AND THROAT WITH OR WITHOUT PRESENCE OF ULCERS (sore throat, sores in mouth, or a toothache) UNUSUAL RASH, SWELLING OR PAIN  UNUSUAL VAGINAL DISCHARGE OR ITCHING   Items with * indicate a potential emergency and should be followed up as soon as possible or go to the Emergency Department if any problems should occur.  Please show the CHEMOTHERAPY ALERT CARD or IMMUNOTHERAPY ALERT  CARD at check-in to the Emergency Department and triage nurse. Should you have questions after your visit or need to cancel or reschedule your appointment, please contact [facility] - A DEPT OF [facility]  [phone] and follow the prompts.  Office hours are 8:00 a.m. to 4:30 p.m. Monday - Friday. Please note that voicemails left after 4:00 p.m. may not be returned until the following business day.  We are closed weekends and major holidays. You have access to a nurse at all times for urgent questions. Please call the main number to the clinic [phone] and follow the prompts.  For any non-urgent questions, you may also contact your provider using MyChart. We now offer e-Visits for anyone 63 and older to request care online for non-urgent symptoms. For details visit [email or web address].   Also download the MyChart app! Go to the app store, search "MyChart", open the app, select [facility], and log in with your MyChart username and password.

## 2023-11-02 NOTE — Patient Instructions (Signed)

## 2023-11-02 NOTE — Progress Notes (Signed)
 Hematology and Oncology Follow Up Visit  Bonnie Russell [id number] 1966/03/30 58 y.o. 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  Everything seems to go pretty well for her.  She is trying to lose weight.  I am sure that she will lose some weight.  Now that is summertime, should be able to get outside a little bit more.  She has had no issues with nausea or vomiting.  She has a little bit of a problem with cough.  We did do a PET scan on her.  Thankfully, the PET scan did not show anything that look like recurrent lymphoma or residual lymphoma.  However, there is a little bit of congestion in the lungs at the bases.  This may just be atelectasis.  However, given that she has some compromised immune system, I think would be a good idea to get some antibiotic.  I will put her on a Z-Pak.  She has had no fever.  She has had no change in bowel or bladder habits.  She has had no rashes.  There is been no bleeding.  She has had no leg swelling.  Overall, I would say that her performance status is probably ECOG 1.  .   Medications:  Current Outpatient Medications:    ALPRAZolam  (XANAX ) 0.5 MG tablet, Take 1 tablet (0.5 mg total) by mouth 2 (two) times daily as needed for anxiety, Disp: 30 tablet, Rfl: 2   amLODipine  (NORVASC ) 5 MG tablet, Take 1 tablet (5 mg total) by mouth daily., Disp: 30 tablet, Rfl: 1   aspirin-acetaminophen -caffeine (EXCEDRIN MIGRAINE) 250-250-65 MG tablet, Take by mouth every 6 (six) hours as needed for headache., Disp: , Rfl:    Cholecalciferol (VITAMIN D3) 50 MCG (2000 UT) CAPS, Take by mouth daily., Disp: , Rfl:    diphenhydrAMINE  HCl, Sleep, (ZZZQUIL) 50 MG/30ML LIQD, Take 1 Capful by mouth at bedtime as needed., Disp: , Rfl:    DULoxetine   (CYMBALTA ) 60 MG capsule, Take 1 capsule (60 mg total) by mouth daily., Disp: 90 capsule, Rfl: 2   metoprolol  succinate (TOPROL -XL) 50 MG 24 hr tablet, Take 1 tablet by mouth daily., Disp: , Rfl:    Multiple Vitamin (MULTIVITAMIN WITH MINERALS) TABS tablet, Take 1 tablet by mouth daily., Disp: 30 tablet, Rfl: 0   valACYclovir  (VALTREX ) 500 MG tablet, Take 500 mg by mouth daily., Disp: , Rfl:   Allergies:  Allergies  Allergen Reactions   Morphine Itching   Sertraline Other (See Comments) and Diarrhea    Diarrhea   Hydroxyzine     Did not like how it made her feel   Morphine And Codeine Itching   Peanut (Diagnostic) Other (See Comments)    Occasionally causes "eye swelling"   Shellfish Allergy Other (See Comments)    Occasionally causes "eye swelling"    Past Medical History, Surgical history, Social history, and Family History were reviewed and updated.  Review of Systems: Review of Systems  Constitutional: Negative.   HENT:  Negative.    Eyes: Negative.   Respiratory: Negative.    Cardiovascular: Negative.   Gastrointestinal: Negative.   Endocrine: Negative.   Genitourinary: Negative.    Musculoskeletal: Negative.   Skin: Negative.   Neurological: Negative.   Hematological: Negative.   Psychiatric/Behavioral: Negative.      Physical  Exam:  Vital signs are temperature of 97.5.  Pulse 107.  Blood pressure 132/86.  Weight is 218 pounds.   Wt Readings from Last 3 Encounters:  11/02/23 218 lb 14.4 oz (99.3 kg)  09/08/23 214 lb (97.1 kg)  08/10/23 211 lb (95.7 kg)    Physical Exam Vitals reviewed.  HENT:     Head: Normocephalic and atraumatic.  Eyes:     Pupils: Pupils are equal, round, and reactive to light.  Cardiovascular:     Rate and Rhythm: Normal rate and regular rhythm.     Heart sounds: Normal heart sounds.  Pulmonary:     Effort: Pulmonary effort is normal.     Breath sounds: Normal breath sounds.  Abdominal:     General: Bowel sounds are normal.      Palpations: Abdomen is soft.  Musculoskeletal:        General: No tenderness or deformity. Normal range of motion.     Cervical back: Normal range of motion.  Lymphadenopathy:     Cervical: No cervical adenopathy.  Skin:    General: Skin is warm and dry.     Findings: No erythema or rash.  Neurological:     Mental Status: She is alert and oriented to person, place, and time.  Psychiatric:        Behavior: Behavior normal.        Thought Content: Thought content normal.        Judgment: Judgment normal.     Lab Results  Component Value Date   WBC 5.5 11/02/2023   HGB 11.4 (L) 11/02/2023   HCT 32.4 (L) 11/02/2023   MCV 103.5 (H) 11/02/2023   PLT 218 11/02/2023     Chemistry      Component Value Date/Time   NA 134 (L) 11/02/2023 1020   K 3.6 11/02/2023 1020   CL 97 (L) 11/02/2023 1020   CO2 24 11/02/2023 1020   BUN 11 11/02/2023 1020   CREATININE 1.13 (H) 11/02/2023 1020      Component Value Date/Time   CALCIUM  9.0 11/02/2023 1020   ALKPHOS 110 11/02/2023 1020   AST 62 (H) 11/02/2023 1020   ALT 50 (H) 11/02/2023 1020   BILITOT 0.9 11/02/2023 1020       Impression and Plan: Bonnie Russell is a very nice 58 year old white female.  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  She has an intermediate prognostic scale.  Everything looks fantastic.  She seems to be in remission.  I think that she would be a good candidate for Rituxan .  Again, she really wants to lose weight.  I do not see a problem with her losing weight.  If she even needs to be on one of the new weight loss drugs, I do not see a problem with this.  I told her that this is some that her family doctor can manage that they think it is appropriate.  We will plan to get her back in another couple months.  Ivor Mars, MD 6/9/202511:51 AM

## 2023-11-03 ENCOUNTER — Encounter: Payer: Self-pay | Admitting: *Deleted

## 2023-11-03 LAB — BETA 2 MICROGLOBULIN, SERUM: Beta-2 Microglobulin: 2.6 mg/L — ABNORMAL HIGH (ref 0.6–2.4)

## 2023-11-03 NOTE — Progress Notes (Signed)
 Patient is doing well. Her post treatment PET showed CR. She will now proceed to maintenance. Patient has not have navigational needs for some time. Will discontinue active navigation at this time, but be available to the patient as needed.   Oncology Nurse Navigator Documentation     11/03/2023    9:30 AM  Oncology Nurse Navigator Flowsheets  Navigation Complete Date: 11/03/2023  Post Navigation: Continue to Follow Patient? No  Reason Not Navigating Patient: Patient On Maintenance Chemotherapy  Navigator Location [facility]  Navigator Encounter Type Appt/Treatment Plan Review  Time Spent with Patient 15

## 2023-11-04 ENCOUNTER — Other Ambulatory Visit: Payer: Self-pay

## 2024-01-03 ENCOUNTER — Other Ambulatory Visit: Payer: Self-pay

## 2024-01-07 ENCOUNTER — Inpatient Hospital Stay (HOSPITAL_BASED_OUTPATIENT_CLINIC_OR_DEPARTMENT_OTHER): Admitting: Hematology & Oncology

## 2024-01-07 ENCOUNTER — Inpatient Hospital Stay

## 2024-01-07 ENCOUNTER — Inpatient Hospital Stay: Attending: Hematology & Oncology

## 2024-01-07 VITALS — BP 119/82 | HR 93 | Temp 98.5°F | Resp 20 | Ht 64.0 in | Wt 220.0 lb

## 2024-01-07 VITALS — BP 118/81 | HR 76 | Resp 18

## 2024-01-07 DIAGNOSIS — C8218 Follicular lymphoma grade II, lymph nodes of multiple sites: Secondary | ICD-10-CM

## 2024-01-07 DIAGNOSIS — C8290 Follicular lymphoma, unspecified, unspecified site: Secondary | ICD-10-CM | POA: Insufficient documentation

## 2024-01-07 DIAGNOSIS — Z5112 Encounter for antineoplastic immunotherapy: Secondary | ICD-10-CM | POA: Diagnosis present

## 2024-01-07 LAB — CBC WITH DIFFERENTIAL (CANCER CENTER ONLY)
Abs Immature Granulocytes: 0.05 K/uL (ref 0.00–0.07)
Basophils Absolute: 0 K/uL (ref 0.0–0.1)
Basophils Relative: 1 %
Eosinophils Absolute: 0.1 K/uL (ref 0.0–0.5)
Eosinophils Relative: 1 %
HCT: 35.2 % — ABNORMAL LOW (ref 36.0–46.0)
Hemoglobin: 12.4 g/dL (ref 12.0–15.0)
Immature Granulocytes: 1 %
Lymphocytes Relative: 11 %
Lymphs Abs: 0.6 K/uL — ABNORMAL LOW (ref 0.7–4.0)
MCH: 35.8 pg — ABNORMAL HIGH (ref 26.0–34.0)
MCHC: 35.2 g/dL (ref 30.0–36.0)
MCV: 101.7 fL — ABNORMAL HIGH (ref 80.0–100.0)
Monocytes Absolute: 0.6 K/uL (ref 0.1–1.0)
Monocytes Relative: 11 %
Neutro Abs: 4.3 K/uL (ref 1.7–7.7)
Neutrophils Relative %: 75 %
Platelet Count: 201 K/uL (ref 150–400)
RBC: 3.46 MIL/uL — ABNORMAL LOW (ref 3.87–5.11)
RDW: 12.9 % (ref 11.5–15.5)
WBC Count: 5.7 K/uL (ref 4.0–10.5)
nRBC: 0 % (ref 0.0–0.2)

## 2024-01-07 LAB — CMP (CANCER CENTER ONLY)
ALT: 57 U/L — ABNORMAL HIGH (ref 0–44)
AST: 74 U/L — ABNORMAL HIGH (ref 15–41)
Albumin: 4.3 g/dL (ref 3.5–5.0)
Alkaline Phosphatase: 148 U/L — ABNORMAL HIGH (ref 38–126)
Anion gap: 15 (ref 5–15)
BUN: 14 mg/dL (ref 6–20)
CO2: 22 mmol/L (ref 22–32)
Calcium: 8.8 mg/dL — ABNORMAL LOW (ref 8.9–10.3)
Chloride: 99 mmol/L (ref 98–111)
Creatinine: 1 mg/dL (ref 0.44–1.00)
GFR, Estimated: >60 mL/min (ref >60)
Glucose, Bld: 141 mg/dL — ABNORMAL HIGH (ref 70–99)
Potassium: 3.9 mmol/L (ref 3.5–5.1)
Sodium: 137 mmol/L (ref 135–145)
Total Bilirubin: 0.6 mg/dL (ref 0.0–1.2)
Total Protein: 6.6 g/dL (ref 6.5–8.1)

## 2024-01-07 LAB — LACTATE DEHYDROGENASE: LDH: 215 U/L — ABNORMAL HIGH (ref 98–192)

## 2024-01-07 MED ORDER — SODIUM CHLORIDE 0.9 % IV SOLN
INTRAVENOUS | Status: DC
Start: 2024-01-07 — End: 2024-01-07

## 2024-01-07 MED ORDER — ACETAMINOPHEN 325 MG PO TABS
650.0000 mg | ORAL_TABLET | Freq: Once | ORAL | Status: AC
  Administered 2024-01-07: 650 mg via ORAL
  Filled 2024-01-07: qty 2

## 2024-01-07 MED ORDER — DIPHENHYDRAMINE HCL 25 MG PO CAPS
50.0000 mg | ORAL_CAPSULE | Freq: Once | ORAL | Status: AC
  Administered 2024-01-07: 50 mg via ORAL
  Filled 2024-01-07: qty 2

## 2024-01-07 MED ORDER — SODIUM CHLORIDE 0.9 % IV SOLN
375.0000 mg/m2 | Freq: Once | INTRAVENOUS | Status: AC
  Administered 2024-01-07: 800 mg via INTRAVENOUS
  Filled 2024-01-07: qty 50

## 2024-01-07 NOTE — Patient Instructions (Signed)

## 2024-01-07 NOTE — Progress Notes (Signed)
 Hematology and Oncology Follow Up Visit  Bonnie Russell [id number] February 15, 1966 58 y.o. 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 is back for follow-up.  Her main is to try to lose weight.  She is gained a couple pounds.  I know the weather has not been all that conducive for her to be outside walking.  Hopefully, with the cooler weather coming, she will be able to go outside walk and burn more calories.  Her LFTs are up a little bit.  I suspect this probably is because her blood sugar is up.  I told her to just be very careful with the carbohydrates so she does not develop a fatty liver.  She has had no problems with nausea or vomiting.  There has been no change in bowel or bladder habits.  She has had no cough or shortness of breath.  She has had no bleeding.  She has had no rashes.  Overall, I will have to say that her performance status is probably ECOG 1.   Medications:  Current Outpatient Medications:    ALPRAZolam  (XANAX ) 0.5 MG tablet, Take 1 tablet (0.5 mg total) by mouth 2 (two) times daily as needed for anxiety, Disp: 30 tablet, Rfl: 2   amLODipine  (NORVASC ) 5 MG tablet, Take 1 tablet (5 mg total) by mouth daily., Disp: 30 tablet, Rfl: 1   aspirin-acetaminophen -caffeine (EXCEDRIN MIGRAINE) 250-250-65 MG tablet, Take by mouth every 6 (six) hours as needed for headache., Disp: , Rfl:    azithromycin  (ZITHROMAX ) 250 MG tablet, Take 2 pills today, then 1 pill a day for 4 days., Disp: 6 each, Rfl: 0   Cholecalciferol (VITAMIN D3) 50 MCG (2000 UT) CAPS, Take by mouth daily., Disp: , Rfl:    diphenhydrAMINE  HCl, Sleep, (ZZZQUIL) 50 MG/30ML LIQD, Take 1 Capful by mouth at bedtime as needed., Disp: , Rfl:    DULoxetine  (CYMBALTA ) 60 MG capsule, Take 1 capsule (60 mg total)  by mouth daily., Disp: 90 capsule, Rfl: 2   metoprolol  succinate (TOPROL -XL) 50 MG 24 hr tablet, Take 1 tablet by mouth daily., Disp: , Rfl:    Multiple Vitamin (MULTIVITAMIN WITH MINERALS) TABS tablet, Take 1 tablet by mouth daily., Disp: 30 tablet, Rfl: 0   valACYclovir  (VALTREX ) 500 MG tablet, Take 500 mg by mouth daily., Disp: , Rfl:   Allergies:  Allergies  Allergen Reactions   Morphine Itching   Sertraline Other (See Comments) and Diarrhea    Diarrhea   Hydroxyzine     Did not like how it made her feel   Morphine And Codeine Itching   Peanut (Diagnostic) Other (See Comments)    Occasionally causes eye swelling   Shellfish Allergy Other (See Comments)    Occasionally causes eye swelling    Past Medical History, Surgical history, Social history, and Family History were reviewed and updated.  Review of Systems: Review of Systems  Constitutional: Negative.   HENT:  Negative.    Eyes: Negative.   Respiratory: Negative.    Cardiovascular: Negative.   Gastrointestinal: Negative.   Endocrine: Negative.   Genitourinary: Negative.    Musculoskeletal: Negative.   Skin: Negative.   Neurological: Negative.   Hematological: Negative.   Psychiatric/Behavioral: Negative.      Physical Exam:  Vital signs are temperature of 98.5.  Pulse 73 blood  pressure 119/82.  Weight is 220 pounds.     Wt Readings from Last 3 Encounters:  01/07/24 220 lb (99.8 kg)  11/02/23 218 lb 14.4 oz (99.3 kg)  09/08/23 214 lb (97.1 kg)    Physical Exam Vitals reviewed.  HENT:     Head: Normocephalic and atraumatic.  Eyes:     Pupils: Pupils are equal, round, and reactive to light.  Cardiovascular:     Rate and Rhythm: Normal rate and regular rhythm.     Heart sounds: Normal heart sounds.  Pulmonary:     Effort: Pulmonary effort is normal.     Breath sounds: Normal breath sounds.  Abdominal:     General: Bowel sounds are normal.     Palpations: Abdomen is soft.  Musculoskeletal:         General: No tenderness or deformity. Normal range of motion.     Cervical back: Normal range of motion.  Lymphadenopathy:     Cervical: No cervical adenopathy.  Skin:    General: Skin is warm and dry.     Findings: No erythema or rash.  Neurological:     Mental Status: She is alert and oriented to person, place, and time.  Psychiatric:        Behavior: Behavior normal.        Thought Content: Thought content normal.        Judgment: Judgment normal.      Lab Results  Component Value Date   WBC 5.7 01/07/2024   HGB 12.4 01/07/2024   HCT 35.2 (L) 01/07/2024   MCV 101.7 (H) 01/07/2024   PLT 201 01/07/2024     Chemistry      Component Value Date/Time   NA 134 (L) 11/02/2023 1020   K 3.6 11/02/2023 1020   CL 97 (L) 11/02/2023 1020   CO2 24 11/02/2023 1020   BUN 11 11/02/2023 1020   CREATININE 1.13 (H) 11/02/2023 1020      Component Value Date/Time   CALCIUM  9.0 11/02/2023 1020   ALKPHOS 110 11/02/2023 1020   AST 62 (H) 11/02/2023 1020   ALT 50 (H) 11/02/2023 1020   BILITOT 0.9 11/02/2023 1020       Impression and Plan: [name] is a very nice 58 year old white female.  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  She has an intermediate prognostic scale.  Again, she is most worried about her weight.  I know that she will lose weight.  I know that she is incredibly motivated.  Her blood counts look okay.  I do not see a problem with giving her the second dose of Rituxan .  We will go ahead and plan to get her back in 2 more months.  Hopefully, her weight will be down below 200 pounds.   [name], MD 8/14/20259:54 AM

## 2024-01-07 NOTE — Patient Instructions (Signed)
 [facility] - A DEPT OF [place]  Discharge Instructions: Thank you for choosing [facility] to provide your oncology and hematology care.   If you have a lab appointment with the Cancer Center, please go directly to the Cancer Center and check in at the registration area.  Wear comfortable clothing and clothing appropriate for easy access to any Portacath or PICC line.   We strive to give you quality time with your provider. You may need to reschedule your appointment if you arrive late (15 or more minutes).  Arriving late affects you and other patients whose appointments are after yours.  Also, if you miss three or more appointments without notifying the office, you may be dismissed from the clinic at the provider's discretion.      For prescription refill requests, have your pharmacy contact our office and allow 72 hours for refills to be completed.    Today you received the following chemotherapy and/or immunotherapy agents Ruxience     To help prevent nausea and vomiting after your treatment, we encourage you to take your nausea medication as directed.  BELOW ARE SYMPTOMS THAT SHOULD BE REPORTED IMMEDIATELY: *FEVER GREATER THAN 100.4 F (38 C) OR HIGHER *CHILLS OR SWEATING *NAUSEA AND VOMITING THAT IS NOT CONTROLLED WITH YOUR NAUSEA MEDICATION *UNUSUAL SHORTNESS OF BREATH *UNUSUAL BRUISING OR BLEEDING *URINARY PROBLEMS (pain or burning when urinating, or frequent urination) *BOWEL PROBLEMS (unusual diarrhea, constipation, pain near the anus) TENDERNESS IN MOUTH AND THROAT WITH OR WITHOUT PRESENCE OF ULCERS (sore throat, sores in mouth, or a toothache) UNUSUAL RASH, SWELLING OR PAIN  UNUSUAL VAGINAL DISCHARGE OR ITCHING   Items with * indicate a potential emergency and should be followed up as soon as possible or go to the Emergency Department if any problems should occur.  Please show the CHEMOTHERAPY ALERT CARD or IMMUNOTHERAPY ALERT  CARD at check-in to the Emergency Department and triage nurse. Should you have questions after your visit or need to cancel or reschedule your appointment, please contact [facility] - A DEPT OF [facility]  [phone] and follow the prompts.  Office hours are 8:00 a.m. to 4:30 p.m. Monday - Friday. Please note that voicemails left after 4:00 p.m. may not be returned until the following business day.  We are closed weekends and major holidays. You have access to a nurse at all times for urgent questions. Please call the main number to the clinic [phone] and follow the prompts.  For any non-urgent questions, you may also contact your provider using MyChart. We now offer e-Visits for anyone 48 and older to request care online for non-urgent symptoms. For details visit [email or web address].   Also download the MyChart app! Go to the app store, search MyChart, open the app, select [place], and log in with your MyChart username and password.

## 2024-01-08 ENCOUNTER — Other Ambulatory Visit: Payer: Self-pay

## 2024-01-08 LAB — IGG, IGA, IGM
IgA: 34 mg/dL — ABNORMAL LOW (ref 87–352)
IgG (Immunoglobin G), Serum: 572 mg/dL — ABNORMAL LOW (ref 586–1602)
IgM (Immunoglobulin M), Srm: 73 mg/dL (ref 26–217)

## 2024-01-11 ENCOUNTER — Other Ambulatory Visit: Payer: Self-pay

## 2024-01-22 ENCOUNTER — Other Ambulatory Visit: Payer: Self-pay

## 2024-03-09 ENCOUNTER — Encounter: Payer: Self-pay | Admitting: Hematology & Oncology

## 2024-03-10 ENCOUNTER — Inpatient Hospital Stay (HOSPITAL_BASED_OUTPATIENT_CLINIC_OR_DEPARTMENT_OTHER): Admitting: Hematology & Oncology

## 2024-03-10 ENCOUNTER — Inpatient Hospital Stay

## 2024-03-10 ENCOUNTER — Inpatient Hospital Stay: Attending: Hematology & Oncology

## 2024-03-10 ENCOUNTER — Encounter: Payer: Self-pay | Admitting: Hematology & Oncology

## 2024-03-10 VITALS — BP 126/87 | HR 88 | Temp 97.6°F | Resp 17 | Wt 224.1 lb

## 2024-03-10 DIAGNOSIS — E66812 Obesity, class 2: Secondary | ICD-10-CM

## 2024-03-10 DIAGNOSIS — C8290 Follicular lymphoma, unspecified, unspecified site: Secondary | ICD-10-CM | POA: Insufficient documentation

## 2024-03-10 DIAGNOSIS — C8218 Follicular lymphoma grade II, lymph nodes of multiple sites: Secondary | ICD-10-CM | POA: Diagnosis not present

## 2024-03-10 DIAGNOSIS — Z6838 Body mass index (BMI) 38.0-38.9, adult: Secondary | ICD-10-CM | POA: Diagnosis not present

## 2024-03-10 DIAGNOSIS — E6609 Other obesity due to excess calories: Secondary | ICD-10-CM | POA: Diagnosis not present

## 2024-03-10 DIAGNOSIS — Z5112 Encounter for antineoplastic immunotherapy: Secondary | ICD-10-CM | POA: Diagnosis present

## 2024-03-10 LAB — CBC WITH DIFFERENTIAL (CANCER CENTER ONLY)
Abs Immature Granulocytes: 0.04 K/uL (ref 0.00–0.07)
Basophils Absolute: 0 K/uL (ref 0.0–0.1)
Basophils Relative: 0 %
Eosinophils Absolute: 0.1 K/uL (ref 0.0–0.5)
Eosinophils Relative: 2 %
HCT: 37 % (ref 36.0–46.0)
Hemoglobin: 13 g/dL (ref 12.0–15.0)
Immature Granulocytes: 1 %
Lymphocytes Relative: 10 %
Lymphs Abs: 0.7 K/uL (ref 0.7–4.0)
MCH: 35.8 pg — ABNORMAL HIGH (ref 26.0–34.0)
MCHC: 35.1 g/dL (ref 30.0–36.0)
MCV: 101.9 fL — ABNORMAL HIGH (ref 80.0–100.0)
Monocytes Absolute: 0.6 K/uL (ref 0.1–1.0)
Monocytes Relative: 9 %
Neutro Abs: 5.3 K/uL (ref 1.7–7.7)
Neutrophils Relative %: 78 %
Platelet Count: 215 K/uL (ref 150–400)
RBC: 3.63 MIL/uL — ABNORMAL LOW (ref 3.87–5.11)
RDW: 12.9 % (ref 11.5–15.5)
WBC Count: 6.7 K/uL (ref 4.0–10.5)
nRBC: 0 % (ref 0.0–0.2)

## 2024-03-10 LAB — CMP (CANCER CENTER ONLY)
ALT: 40 U/L (ref 0–44)
AST: 57 U/L — ABNORMAL HIGH (ref 15–41)
Albumin: 4.3 g/dL (ref 3.5–5.0)
Alkaline Phosphatase: 164 U/L — ABNORMAL HIGH (ref 38–126)
Anion gap: 15 (ref 5–15)
BUN: 15 mg/dL (ref 6–20)
CO2: 22 mmol/L (ref 22–32)
Calcium: 9 mg/dL (ref 8.9–10.3)
Chloride: 97 mmol/L — ABNORMAL LOW (ref 98–111)
Creatinine: 0.99 mg/dL (ref 0.44–1.00)
GFR, Estimated: >60 mL/min (ref >60)
Glucose, Bld: 151 mg/dL — ABNORMAL HIGH (ref 70–99)
Potassium: 3.6 mmol/L (ref 3.5–5.1)
Sodium: 133 mmol/L — ABNORMAL LOW (ref 135–145)
Total Bilirubin: 0.6 mg/dL (ref 0.0–1.2)
Total Protein: 6.5 g/dL (ref 6.5–8.1)

## 2024-03-10 LAB — LACTATE DEHYDROGENASE: LDH: 175 U/L (ref 98–192)

## 2024-03-10 MED ORDER — ACETAMINOPHEN 325 MG PO TABS
650.0000 mg | ORAL_TABLET | Freq: Once | ORAL | Status: AC
  Administered 2024-03-10: 650 mg via ORAL

## 2024-03-10 MED ORDER — SODIUM CHLORIDE 0.9 % IV SOLN: INTRAVENOUS | Status: AC

## 2024-03-10 MED ORDER — DIPHENHYDRAMINE HCL 25 MG PO CAPS
50.0000 mg | ORAL_CAPSULE | Freq: Once | ORAL | Status: AC
  Administered 2024-03-10: 50 mg via ORAL

## 2024-03-10 MED ORDER — SODIUM CHLORIDE 0.9 % IV SOLN
375.0000 mg/m2 | Freq: Once | INTRAVENOUS | Status: AC
  Administered 2024-03-10: 800 mg via INTRAVENOUS
  Filled 2024-03-10: qty 50

## 2024-03-10 NOTE — Patient Instructions (Signed)
Rituximab; Hyaluronidase Injection What is this medication? RITUXIMAB; HYALURONIDASE (ri TUX i mab; hye al ur ON i dase) treats leukemia and lymphoma. Rituximab works by blocking a protein that causes cancer cells to grow and multiply. This helps to slow or stop the spread of cancer cells. Hyaluronidase works by increasing the absorption of the other medications in the body to help them work better. It is a combination medication that contains a monoclonal antibody. This medicine may be used for other purposes; ask your health care provider or pharmacist if you have questions. COMMON BRAND NAME(S): Rituxan Hycela What should I tell my care team before I take this medication? They need to know if you have any of these conditions: Heart disease Immune system problems Infection, such as hepatitis B, chickenpox, cold sores, herpes Irregular heartbeat Kidney disease Lung or breathing disease, such as asthma Recently received or scheduled to receive a vaccine An unusual or allergic reaction to rituximab, rituximab;hyaluronidase, mouse proteins, other medications, foods, dyes, or preservatives Pregnant or trying to get pregnant Breast-feeding How should I use this medication? This medication is for injection under the skin. It is given by a care team in a hospital or clinic setting. A special MedGuide will be given to you before each treatment. Be sure to read this information carefully each time. Talk to your care team about the use of this medication in children. Special care may be needed. Overdosage: If you think you have taken too much of this medicine contact a poison control center or emergency room at once. NOTE: This medicine is only for you. Do not share this medicine with others. What if I miss a dose? Keep appointments for follow-up doses. It is important not to miss your dose. Call your care team if you are unable to keep an appointment. What may interact with this medication? Do not  take this medication with any of the following: Live virus vaccines This medication may also interact with the following: Cisplatin This list may not describe all possible interactions. Give your health care provider a list of all the medicines, herbs, non-prescription drugs, or dietary supplements you use. Also tell them if you smoke, drink alcohol, or use illegal drugs. Some items may interact with your medicine. What should I watch for while using this medication? Your condition will be monitored carefully while you are receiving this medication. You may need blood work while taking this medication. This medication can cause serious allergic reactions. To reduce the risk, your care team may give you other medications to take before receiving this one. Be sure to follow the directions from your care team. In some patients, this medication may cause a serious brain infection that may cause death. If you have any problems seeing, thinking, speaking, walking, or standing, tell your care team right away. If you cannot reach your care team, urgently seek other source of medical care. This medication may increase your risk of getting an infection. Call your care team for advice if you get a fever, chills, sore throat, or other symptoms of a cold or flu. Do not treat yourself. Try to avoid being around people who are sick. Talk to your care team if you may be pregnant. Serious birth defects can occur if you take this medication during pregnancy and for 12 months after the last dose. Your will need a negative pregnancy test before starting this medication. Contraception is recommended while taking this medication and for 12 months after the last dose. Your care  team can help you find the option that works for you. Do not breastfeed while taking this medication and for at least 6 months after the last dose. What side effects may I notice from receiving this medication? Side effects that you should report to  your care team as soon as possible: Allergic reactions or angioedema--skin rash, itching or hives, swelling of the face, eyes, lips, tongue, arms, or legs, trouble swallowing or breathing Bowel blockage--stomach cramping, unable to have a bowel movement or pass gas, loss of appetite, vomiting Dizziness, loss of balance or coordination, confusion or trouble speaking Fever, chills, unusual weakness or fatigue, loss of appetite, nausea, headache, dizziness, feeling faint or lightheaded, shortness of breath, fast or irregular heartbeat, which may be signs of cytokine release syndrome Heart attack--pain or tightness in the chest, shoulders, arms, or jaw, nausea, shortness of breath, cold or clammy skin, feeling faint or lightheaded Heart rhythm changes--fast or irregular heartbeat, dizziness, feeling faint or lightheaded, chest pain, trouble breathing Infection--fever, chills, cough, sore throat, wounds that don't heal, pain or trouble when passing urine, general feeling of discomfort or being unwell Kidney injury--decrease in the amount of urine, swelling of the ankles, hands, or feet Liver injury--right upper belly pain, loss of appetite, nausea, light-colored stool, dark yellow or brown urine, yellowing skin or eyes, unusual weakness or fatigue Redness, blistering, peeling, or loosening of the skin, including inside the mouth Stomach pain that is severe, does not go away, or gets worse Tumor lysis syndrome (TLS)--nausea, vomiting, diarrhea, decrease in the amount of urine, dark urine, unusual weakness or fatigue, confusion, muscle pain or cramps, fast or irregular heartbeat, joint pain Side effects that usually do not require medical attention (report these to your care team if they continue or are bothersome): Constipation Fatigue Hair loss Nausea Pain, redness, or irritation at injection site This list may not describe all possible side effects. Call your doctor for medical advice about side  effects. You may report side effects to FDA at 1-800-FDA-1088. Where should I keep my medication? This medication is given in a hospital or clinic. It will not be stored at home. NOTE: This sheet is a summary. It may not cover all possible information. If you have questions about this medicine, talk to your doctor, pharmacist, or health care provider.  2024 [employer]vier/Gold Standard (2021-10-03 00:00:00)

## 2024-03-10 NOTE — Progress Notes (Signed)
 Hematology and Oncology Follow Up Visit  Bonnie Russell [id number] 07/12/1965 58 y.o. 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  She still having some issues.  However, she is managing bowels well as she can possibly manage.  She has had a very tough year.  I know that she will get through all the struggles.  She really has a lot of strength.  She has good support from her family.  I know that she is worried about her weight.  This is going up a little bit.  Again, I know that with the cooler weather, she will be able to be outside and exercise a little bit more.  She has had no fever.  She has had no change in bowel or bladder habits.  She has had no cough or shortness of breath.  She has had no rashes.  There is been no bleeding.  She has had no headache.  Currently, I would say that her performance status is probably ECOG 1.  Medications:  Current Outpatient Medications:    ALPRAZolam  (XANAX ) 0.5 MG tablet, Take 1 tablet (0.5 mg total) by mouth 2 (two) times daily as needed for anxiety, Disp: 30 tablet, Rfl: 2   amLODipine  (NORVASC ) 5 MG tablet, Take 1 tablet (5 mg total) by mouth daily., Disp: 30 tablet, Rfl: 1   aspirin-acetaminophen -caffeine (EXCEDRIN MIGRAINE) 250-250-65 MG tablet, Take by mouth every 6 (six) hours as needed for headache., Disp: , Rfl:    Cholecalciferol (VITAMIN D3) 50 MCG (2000 UT) CAPS, Take by mouth daily., Disp: , Rfl:    diphenhydrAMINE  HCl, Sleep, (ZZZQUIL) 50 MG/30ML LIQD, Take 1 Capful by mouth at bedtime as needed., Disp: , Rfl:    DULoxetine  (CYMBALTA ) 60 MG capsule, Take 1 capsule (60 mg total) by mouth daily., Disp: 90 capsule, Rfl: 2   metoprolol  succinate (TOPROL -XL) 50 MG 24 hr tablet,  Take 1 tablet by mouth daily., Disp: , Rfl:    Multiple Vitamin (MULTIVITAMIN WITH MINERALS) TABS tablet, Take 1 tablet by mouth daily., Disp: 30 tablet, Rfl: 0   valACYclovir  (VALTREX ) 500 MG tablet, Take 500 mg by mouth daily., Disp: , Rfl:   Allergies:  Allergies  Allergen Reactions   Morphine Itching   Sertraline Other (See Comments) and Diarrhea    Diarrhea   Hydroxyzine     Did not like how it made her feel   Morphine And Codeine Itching   Peanut (Diagnostic) Other (See Comments)    Occasionally causes eye swelling   Shellfish Allergy Other (See Comments)    Occasionally causes eye swelling    Past Medical History, Surgical history, Social history, and Family History were reviewed and updated.  Review of Systems: Review of Systems  Constitutional: Negative.   HENT:  Negative.    Eyes: Negative.   Respiratory: Negative.    Cardiovascular: Negative.   Gastrointestinal: Negative.   Endocrine: Negative.   Genitourinary: Negative.    Musculoskeletal: Negative.   Skin: Negative.   Neurological: Negative.   Hematological: Negative.   Psychiatric/Behavioral: Negative.      Physical Exam:  Vital signs are temperature of 98.5.  Pulse 73 blood pressure 119/82.  Weight is 224 pounds.     Wt Readings from Last 3 Encounters:  03/10/24 224 lb  1.9 oz (101.7 kg)  01/07/24 220 lb (99.8 kg)  11/02/23 218 lb 14.4 oz (99.3 kg)    Physical Exam Vitals reviewed.  HENT:     Head: Normocephalic and atraumatic.  Eyes:     Pupils: Pupils are equal, round, and reactive to light.  Cardiovascular:     Rate and Rhythm: Normal rate and regular rhythm.     Heart sounds: Normal heart sounds.  Pulmonary:     Effort: Pulmonary effort is normal.     Breath sounds: Normal breath sounds.  Abdominal:     General: Bowel sounds are normal.     Palpations: Abdomen is soft.  Musculoskeletal:        General: No tenderness or deformity. Normal range of motion.     Cervical back: Normal  range of motion.  Lymphadenopathy:     Cervical: No cervical adenopathy.  Skin:    General: Skin is warm and dry.     Findings: No erythema or rash.  Neurological:     Mental Status: She is alert and oriented to person, place, and time.  Psychiatric:        Behavior: Behavior normal.        Thought Content: Thought content normal.        Judgment: Judgment normal.      Lab Results  Component Value Date   WBC 6.7 03/10/2024   HGB 13.0 03/10/2024   HCT 37.0 03/10/2024   MCV 101.9 (H) 03/10/2024   PLT 215 03/10/2024     Chemistry      Component Value Date/Time   NA 137 01/07/2024 0934   K 3.9 01/07/2024 0934   CL 99 01/07/2024 0934   CO2 22 01/07/2024 0934   BUN 14 01/07/2024 0934   CREATININE 1.00 01/07/2024 0934      Component Value Date/Time   CALCIUM  8.8 (L) 01/07/2024 0934   ALKPHOS 148 (H) 01/07/2024 0934   AST 74 (H) 01/07/2024 0934   ALT 57 (H) 01/07/2024 0934   BILITOT 0.6 01/07/2024 0934       Impression and Plan: Bonnie Russell is a very nice 58 year old white female.  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  She has an intermediate prognostic scale.  She completed all of her chemo therapy with Rituxan /Bendamustine .  She did very well with this.  She has been in remission.    She now is on maintenance Rituxan .  We will continue her on the maintenance Rituxan .  Her blood counts look great.  Her blood sugar is up a little bit.  Her alkaline phosphatase is also up a little bit which I suspect is from the elevated glucose.  We will get her back in 2 months.  I do not see a reason to do any scans on her right now.  I totally encouraged her.  I will certainly pray for her.  I know she has a strong faith.  I know she is a strong woman.   [name], MD 10/16/202510:10 AM

## 2024-03-10 NOTE — Patient Instructions (Signed)

## 2024-03-11 ENCOUNTER — Other Ambulatory Visit: Payer: Self-pay

## 2024-03-11 LAB — IGG, IGA, IGM
IgA: 37 mg/dL — ABNORMAL LOW (ref 87–352)
IgG (Immunoglobin G), Serum: 591 mg/dL (ref 586–1602)
IgM (Immunoglobulin M), Srm: 73 mg/dL (ref 26–217)

## 2024-03-29 ENCOUNTER — Other Ambulatory Visit: Payer: Self-pay

## 2024-05-12 ENCOUNTER — Inpatient Hospital Stay

## 2024-05-12 ENCOUNTER — Encounter: Payer: Self-pay | Admitting: Hematology & Oncology

## 2024-05-12 ENCOUNTER — Inpatient Hospital Stay: Admitting: Hematology & Oncology

## 2024-05-12 ENCOUNTER — Ambulatory Visit: Payer: Self-pay | Admitting: Hematology & Oncology

## 2024-05-12 ENCOUNTER — Other Ambulatory Visit: Payer: Self-pay

## 2024-05-12 ENCOUNTER — Inpatient Hospital Stay: Attending: Hematology & Oncology

## 2024-05-12 VITALS — BP 112/80 | HR 83 | Temp 98.2°F | Resp 18

## 2024-05-12 VITALS — BP 133/81 | HR 87 | Temp 97.9°F | Resp 18 | Ht 64.0 in | Wt 232.0 lb

## 2024-05-12 DIAGNOSIS — C8290 Follicular lymphoma, unspecified, unspecified site: Secondary | ICD-10-CM | POA: Insufficient documentation

## 2024-05-12 DIAGNOSIS — C8218 Follicular lymphoma grade II, lymph nodes of multiple sites: Secondary | ICD-10-CM | POA: Diagnosis not present

## 2024-05-12 DIAGNOSIS — Z79620 Long term (current) use of immunosuppressive biologic: Secondary | ICD-10-CM | POA: Diagnosis not present

## 2024-05-12 DIAGNOSIS — Z5112 Encounter for antineoplastic immunotherapy: Secondary | ICD-10-CM | POA: Diagnosis present

## 2024-05-12 DIAGNOSIS — E6609 Other obesity due to excess calories: Secondary | ICD-10-CM

## 2024-05-12 LAB — CMP (CANCER CENTER ONLY)
ALT: 38 U/L (ref 0–44)
AST: 68 U/L — ABNORMAL HIGH (ref 15–41)
Albumin: 4.3 g/dL (ref 3.5–5.0)
Alkaline Phosphatase: 173 U/L — ABNORMAL HIGH (ref 38–126)
Anion gap: 16 — ABNORMAL HIGH (ref 5–15)
BUN: 9 mg/dL (ref 6–20)
CO2: 23 mmol/L (ref 22–32)
Calcium: 9.1 mg/dL (ref 8.9–10.3)
Chloride: 97 mmol/L — ABNORMAL LOW (ref 98–111)
Creatinine: 1.04 mg/dL — ABNORMAL HIGH (ref 0.44–1.00)
GFR, Estimated: >60 mL/min (ref >60)
Glucose, Bld: 175 mg/dL — ABNORMAL HIGH (ref 70–99)
Potassium: 4 mmol/L (ref 3.5–5.1)
Sodium: 136 mmol/L (ref 135–145)
Total Bilirubin: 0.6 mg/dL (ref 0.0–1.2)
Total Protein: 6.8 g/dL (ref 6.5–8.1)

## 2024-05-12 LAB — CBC WITH DIFFERENTIAL (CANCER CENTER ONLY)
Abs Immature Granulocytes: 0.09 K/uL — ABNORMAL HIGH (ref 0.00–0.07)
Basophils Absolute: 0.1 K/uL (ref 0.0–0.1)
Basophils Relative: 1 %
Eosinophils Absolute: 0.1 K/uL (ref 0.0–0.5)
Eosinophils Relative: 1 %
HCT: 38.5 % (ref 36.0–46.0)
Hemoglobin: 13.3 g/dL (ref 12.0–15.0)
Immature Granulocytes: 1 %
Lymphocytes Relative: 5 %
Lymphs Abs: 0.6 K/uL — ABNORMAL LOW (ref 0.7–4.0)
MCH: 35 pg — ABNORMAL HIGH (ref 26.0–34.0)
MCHC: 34.5 g/dL (ref 30.0–36.0)
MCV: 101.3 fL — ABNORMAL HIGH (ref 80.0–100.0)
Monocytes Absolute: 0.8 K/uL (ref 0.1–1.0)
Monocytes Relative: 7 %
Neutro Abs: 9 K/uL — ABNORMAL HIGH (ref 1.7–7.7)
Neutrophils Relative %: 85 %
Platelet Count: 246 K/uL (ref 150–400)
RBC: 3.8 MIL/uL — ABNORMAL LOW (ref 3.87–5.11)
RDW: 13.5 % (ref 11.5–15.5)
WBC Count: 10.6 K/uL — ABNORMAL HIGH (ref 4.0–10.5)
nRBC: 0 % (ref 0.0–0.2)

## 2024-05-12 LAB — IRON AND IRON BINDING CAPACITY (CC-WL,HP ONLY)
Iron: 82 ug/dL (ref 28–170)
Saturation Ratios: 20 % (ref 10.4–31.8)
TIBC: 407 ug/dL (ref 250–450)
UIBC: 325 ug/dL

## 2024-05-12 LAB — LACTATE DEHYDROGENASE: LDH: 180 U/L (ref 105–235)

## 2024-05-12 LAB — FERRITIN: Ferritin: 245 ng/mL (ref 11–307)

## 2024-05-12 LAB — TSH: TSH: 2.5 u[IU]/mL (ref 0.350–4.500)

## 2024-05-12 MED ORDER — ACETAMINOPHEN 325 MG PO TABS
650.0000 mg | ORAL_TABLET | Freq: Once | ORAL | Status: AC
  Administered 2024-05-12: 12:00:00 650 mg via ORAL
  Filled 2024-05-12: qty 2

## 2024-05-12 MED ORDER — SODIUM CHLORIDE 0.9 % IV SOLN: INTRAVENOUS | Status: DC

## 2024-05-12 MED ORDER — DIPHENHYDRAMINE HCL 25 MG PO CAPS
50.0000 mg | ORAL_CAPSULE | Freq: Once | ORAL | Status: AC
  Administered 2024-05-12: 12:00:00 50 mg via ORAL
  Filled 2024-05-12: qty 2

## 2024-05-12 MED ORDER — SODIUM CHLORIDE 0.9 % IV SOLN
375.0000 mg/m2 | Freq: Once | INTRAVENOUS | Status: AC
  Administered 2024-05-12: 12:00:00 800 mg via INTRAVENOUS
  Filled 2024-05-12: qty 50

## 2024-05-12 NOTE — Progress Notes (Signed)
 Hematology and Oncology Follow Up Visit  Bonnie Russell [id number] 22-Jun-1965 58 y.o. 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  The big news is that she might be moving to [place], [place] .  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  He wants her to come with her.  I suspect that she probably will move.  It sounds like a great opportunity for her.  I told her that we could certainly find an oncologist out in [place].  She had a nice Thanksgiving.  She is worried about the weight gain.  I am sure that she will lose weight.  I know the holidays, has been attempting to have a lot of goodies to eat.  She has had no problems with fever.  She has had no cough or shortness of breath.  She has had no nausea or vomiting.  She has had no change in bowel or bladder habits.  There is been no bleeding.  She has had no rashes.  There is been no leg swelling.  Overall, I would have to say that her performance status is probably ECOG 1.    Medications:  Current Outpatient Medications:    ALPRAZolam  (XANAX ) 0.5 MG tablet, Take 1 tablet (0.5 mg total) by mouth 2 (two) times daily as needed for anxiety, Disp: 30 tablet, Rfl: 2   amLODipine  (NORVASC ) 5 MG tablet, Take 1 tablet (5 mg total) by mouth daily., Disp: 30 tablet, Rfl: 1   aspirin-acetaminophen -caffeine (EXCEDRIN MIGRAINE) 250-250-65 MG tablet, Take by mouth every 6 (six) hours as needed for headache., Disp: , Rfl:    Cholecalciferol (VITAMIN D3) 50 MCG (2000 UT) CAPS, Take by mouth daily., Disp: , Rfl:    diphenhydrAMINE  HCl, Sleep, (ZZZQUIL) 50 MG/30ML LIQD, Take 1 Capful by mouth at bedtime as needed., Disp: , Rfl:    DULoxetine  (CYMBALTA ) 60  MG capsule, Take 1 capsule (60 mg total) by mouth daily., Disp: 90 capsule, Rfl: 2   metoprolol  succinate (TOPROL -XL) 50 MG 24 hr tablet, Take 1 tablet by mouth daily., Disp: , Rfl:    Multiple Vitamin (MULTIVITAMIN WITH MINERALS) TABS tablet, Take 1 tablet by mouth daily., Disp: 30 tablet, Rfl: 0   valACYclovir  (VALTREX ) 500 MG tablet, Take 500 mg by mouth daily., Disp: , Rfl:  No current facility-administered medications for this visit.  Facility-Administered Medications Ordered in Other Visits:    0.9 %  sodium chloride  infusion, , Intravenous, Continuous, Kvion Shapley, Maude SAUNDERS, MD, Stopped at 03/10/24 1505  Allergies:  Allergies  Allergen Reactions   Morphine Itching   Sertraline Other (See Comments) and Diarrhea    Diarrhea   Hydroxyzine     Did not like how it made her feel   Morphine And Codeine Itching   Peanut (Diagnostic) Other (See Comments)    Occasionally causes eye swelling   Shellfish Allergy Other (See Comments)    Occasionally causes eye swelling    Past Medical History, Surgical history, Social history, and Family History were reviewed and updated.  Review of Systems: Review of Systems  Constitutional: Negative.   HENT:  Negative.    Eyes: Negative.   Respiratory: Negative.    Cardiovascular: Negative.   Gastrointestinal: Negative.   Endocrine: Negative.  Genitourinary: Negative.    Musculoskeletal: Negative.   Skin: Negative.   Neurological: Negative.   Hematological: Negative.   Psychiatric/Behavioral: Negative.      Physical Exam:  Vital signs are temperature of 98.5.  Pulse 73.  blood pressure 133/81.  Weight is 232 pounds.     Wt Readings from Last 3 Encounters:  03/10/24 224 lb 1.9 oz (101.7 kg)  01/07/24 220 lb (99.8 kg)  11/02/23 218 lb 14.4 oz (99.3 kg)    Physical Exam Vitals reviewed.  HENT:     Head: Normocephalic and atraumatic.  Eyes:     Pupils: Pupils are equal, round, and reactive to light.  Cardiovascular:     Rate and  Rhythm: Normal rate and regular rhythm.     Heart sounds: Normal heart sounds.  Pulmonary:     Effort: Pulmonary effort is normal.     Breath sounds: Normal breath sounds.  Abdominal:     General: Bowel sounds are normal.     Palpations: Abdomen is soft.  Musculoskeletal:        General: No tenderness or deformity. Normal range of motion.     Cervical back: Normal range of motion.  Lymphadenopathy:     Cervical: No cervical adenopathy.  Skin:    General: Skin is warm and dry.     Findings: No erythema or rash.  Neurological:     Mental Status: She is alert and oriented to person, place, and time.  Psychiatric:        Behavior: Behavior normal.        Thought Content: Thought content normal.        Judgment: Judgment normal.      Lab Results  Component Value Date   WBC 10.6 (H) 05/12/2024   HGB 13.3 05/12/2024   HCT 38.5 05/12/2024   MCV 101.3 (H) 05/12/2024   PLT 246 05/12/2024     Chemistry      Component Value Date/Time   NA 136 05/12/2024 1007   K 4.0 05/12/2024 1007   CL 97 (L) 05/12/2024 1007   CO2 23 05/12/2024 1007   BUN 9 05/12/2024 1007   CREATININE 1.04 (H) 05/12/2024 1007      Component Value Date/Time   CALCIUM  9.1 05/12/2024 1007   ALKPHOS 173 (H) 05/12/2024 1007   AST 68 (H) 05/12/2024 1007   ALT 38 05/12/2024 1007   BILITOT 0.6 05/12/2024 1007       Impression and Plan: Ms. Lynde is a very nice 58 year old white female.  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  She has an intermediate prognostic scale.  She completed all of her chemo therapy with Rituxan /Bendamustine .  She did very well with this.  She has been in remission.    She now is on maintenance Rituxan .  We will continue her on the maintenance Rituxan .  Her blood counts look great.  Her blood sugar is up a little bit.  Her alkaline phosphatase is also up a little bit which I suspect is from the elevated glucose.  We will get her back in 2  months.  I do not see a reason to do any scans on her right now.  I told her that when she knows exactly what is going to happen with her moving, we can always  make arrangements out in [place] or she can always come back to see us  since it is only once every 2 months.  I know that she will  work hard to lose weight.  I know she is very motivated.  [name], MD 12/18/202511:30 AM

## 2024-05-12 NOTE — Patient Instructions (Signed)

## 2024-05-12 NOTE — Addendum Note (Signed)
 Addended by: TIMMY COY R on: 05/12/2024 11:49 AM   Modules accepted: Orders

## 2024-05-13 ENCOUNTER — Encounter: Payer: Self-pay | Admitting: *Deleted

## 2024-05-13 ENCOUNTER — Other Ambulatory Visit: Payer: Self-pay

## 2024-05-14 ENCOUNTER — Other Ambulatory Visit: Payer: Self-pay

## 2024-05-25 ENCOUNTER — Other Ambulatory Visit: Payer: Self-pay

## 2024-05-27 ENCOUNTER — Other Ambulatory Visit: Payer: Self-pay

## 2024-07-14 ENCOUNTER — Inpatient Hospital Stay: Attending: Hematology & Oncology

## 2024-07-14 ENCOUNTER — Inpatient Hospital Stay

## 2024-07-14 ENCOUNTER — Inpatient Hospital Stay: Admitting: Hematology & Oncology
